# Patient Record
Sex: Female | Born: 1935 | Race: White | Hispanic: No | Marital: Married | Smoking: Never smoker
Health system: Southern US, Community
[De-identification: ages and names within clinical notes are randomized; demographics above are authoritative.]

## PROBLEM LIST (undated history)

## (undated) DIAGNOSIS — I10 Essential (primary) hypertension: Secondary | ICD-10-CM

## (undated) DIAGNOSIS — G3184 Mild cognitive impairment, so stated: Secondary | ICD-10-CM

## (undated) DIAGNOSIS — I671 Cerebral aneurysm, nonruptured: Secondary | ICD-10-CM

## (undated) DIAGNOSIS — M1990 Unspecified osteoarthritis, unspecified site: Secondary | ICD-10-CM

## (undated) DIAGNOSIS — N318 Other neuromuscular dysfunction of bladder: Secondary | ICD-10-CM

## (undated) HISTORY — PX: BREAST SURGERY: SHX581

## (undated) HISTORY — PX: TOTAL KNEE ARTHROPLASTY: SHX125

## (undated) HISTORY — DX: Cerebral aneurysm, nonruptured: I67.1

## (undated) HISTORY — PX: TONSILLECTOMY: SHX5217

## (undated) HISTORY — PX: CSF SHUNT: SHX92

## (undated) HISTORY — DX: Essential (primary) hypertension: I10

## (undated) HISTORY — DX: Mild cognitive impairment, so stated: G31.84

## (undated) HISTORY — DX: Other neuromuscular dysfunction of bladder: N31.8

## (undated) HISTORY — DX: Unspecified osteoarthritis, unspecified site: M19.90

---

## 1998-01-06 ENCOUNTER — Other Ambulatory Visit: Admission: RE | Admit: 1998-01-06 | Discharge: 1998-01-06 | Payer: Self-pay | Admitting: Internal Medicine

## 1998-03-14 ENCOUNTER — Inpatient Hospital Stay (HOSPITAL_COMMUNITY): Admission: RE | Admit: 1998-03-14 | Discharge: 1998-03-15 | Payer: Self-pay | Admitting: Neurosurgery

## 1998-03-16 ENCOUNTER — Other Ambulatory Visit: Admission: RE | Admit: 1998-03-16 | Discharge: 1998-03-16 | Payer: Self-pay | Admitting: Gynecology

## 1998-05-01 ENCOUNTER — Other Ambulatory Visit: Admission: RE | Admit: 1998-05-01 | Discharge: 1998-05-01 | Payer: Self-pay | Admitting: Gynecology

## 1998-06-19 ENCOUNTER — Ambulatory Visit (HOSPITAL_COMMUNITY): Admission: RE | Admit: 1998-06-19 | Discharge: 1998-06-19 | Payer: Self-pay | Admitting: Gynecology

## 1998-12-06 ENCOUNTER — Encounter: Payer: Self-pay | Admitting: Specialist

## 1998-12-06 ENCOUNTER — Ambulatory Visit (HOSPITAL_COMMUNITY): Admission: RE | Admit: 1998-12-06 | Discharge: 1998-12-06 | Payer: Self-pay | Admitting: Specialist

## 1999-07-19 ENCOUNTER — Other Ambulatory Visit: Admission: RE | Admit: 1999-07-19 | Discharge: 1999-07-19 | Payer: Self-pay | Admitting: Gynecology

## 1999-08-23 ENCOUNTER — Encounter: Payer: Self-pay | Admitting: Gynecology

## 1999-08-23 ENCOUNTER — Encounter: Admission: RE | Admit: 1999-08-23 | Discharge: 1999-08-23 | Payer: Self-pay | Admitting: Gynecology

## 2000-08-05 ENCOUNTER — Other Ambulatory Visit: Admission: RE | Admit: 2000-08-05 | Discharge: 2000-08-05 | Payer: Self-pay | Admitting: Internal Medicine

## 2000-08-25 ENCOUNTER — Encounter: Payer: Self-pay | Admitting: Gynecology

## 2000-08-25 ENCOUNTER — Encounter: Admission: RE | Admit: 2000-08-25 | Discharge: 2000-08-25 | Payer: Self-pay | Admitting: Gynecology

## 2001-03-13 ENCOUNTER — Other Ambulatory Visit: Admission: RE | Admit: 2001-03-13 | Discharge: 2001-03-13 | Payer: Self-pay | Admitting: Gynecology

## 2001-03-13 ENCOUNTER — Encounter (INDEPENDENT_AMBULATORY_CARE_PROVIDER_SITE_OTHER): Payer: Self-pay | Admitting: *Deleted

## 2001-09-07 ENCOUNTER — Encounter: Admission: RE | Admit: 2001-09-07 | Discharge: 2001-09-07 | Payer: Self-pay | Admitting: Internal Medicine

## 2001-09-07 ENCOUNTER — Other Ambulatory Visit: Admission: RE | Admit: 2001-09-07 | Discharge: 2001-09-07 | Payer: Self-pay | Admitting: Internal Medicine

## 2001-09-07 ENCOUNTER — Encounter: Payer: Self-pay | Admitting: Internal Medicine

## 2001-09-10 ENCOUNTER — Encounter: Payer: Self-pay | Admitting: Internal Medicine

## 2001-09-10 ENCOUNTER — Encounter: Admission: RE | Admit: 2001-09-10 | Discharge: 2001-09-10 | Payer: Self-pay | Admitting: Internal Medicine

## 2001-12-23 ENCOUNTER — Encounter (INDEPENDENT_AMBULATORY_CARE_PROVIDER_SITE_OTHER): Payer: Self-pay | Admitting: *Deleted

## 2001-12-23 ENCOUNTER — Ambulatory Visit (HOSPITAL_COMMUNITY): Admission: RE | Admit: 2001-12-23 | Discharge: 2001-12-23 | Payer: Self-pay | Admitting: Gastroenterology

## 2002-09-08 ENCOUNTER — Encounter: Payer: Self-pay | Admitting: Internal Medicine

## 2002-09-08 ENCOUNTER — Ambulatory Visit (HOSPITAL_COMMUNITY): Admission: RE | Admit: 2002-09-08 | Discharge: 2002-09-08 | Payer: Self-pay | Admitting: Internal Medicine

## 2002-09-30 ENCOUNTER — Encounter: Admission: RE | Admit: 2002-09-30 | Discharge: 2002-12-29 | Payer: Self-pay | Admitting: Internal Medicine

## 2003-01-03 ENCOUNTER — Encounter: Payer: Self-pay | Admitting: Orthopedic Surgery

## 2003-01-04 ENCOUNTER — Inpatient Hospital Stay (HOSPITAL_COMMUNITY): Admission: RE | Admit: 2003-01-04 | Discharge: 2003-01-07 | Payer: Self-pay | Admitting: Orthopedic Surgery

## 2003-01-17 ENCOUNTER — Ambulatory Visit (HOSPITAL_COMMUNITY): Admission: RE | Admit: 2003-01-17 | Discharge: 2003-01-17 | Payer: Self-pay | Admitting: *Deleted

## 2003-09-23 ENCOUNTER — Ambulatory Visit (HOSPITAL_COMMUNITY): Admission: RE | Admit: 2003-09-23 | Discharge: 2003-09-23 | Payer: Self-pay | Admitting: Internal Medicine

## 2004-12-18 ENCOUNTER — Encounter: Admission: RE | Admit: 2004-12-18 | Discharge: 2004-12-18 | Payer: Self-pay | Admitting: Internal Medicine

## 2004-12-25 ENCOUNTER — Encounter: Admission: RE | Admit: 2004-12-25 | Discharge: 2004-12-25 | Payer: Self-pay | Admitting: Internal Medicine

## 2005-09-09 LAB — HM COLONOSCOPY

## 2005-09-16 ENCOUNTER — Ambulatory Visit: Payer: Self-pay | Admitting: Internal Medicine

## 2006-01-01 ENCOUNTER — Encounter: Admission: RE | Admit: 2006-01-01 | Discharge: 2006-01-01 | Payer: Self-pay | Admitting: Internal Medicine

## 2006-03-25 ENCOUNTER — Ambulatory Visit: Payer: Self-pay | Admitting: Internal Medicine

## 2006-06-12 ENCOUNTER — Ambulatory Visit: Payer: Self-pay | Admitting: Internal Medicine

## 2006-07-01 ENCOUNTER — Ambulatory Visit: Payer: Self-pay | Admitting: Internal Medicine

## 2006-09-16 ENCOUNTER — Ambulatory Visit: Payer: Self-pay | Admitting: Internal Medicine

## 2007-01-13 ENCOUNTER — Ambulatory Visit: Payer: Self-pay | Admitting: Internal Medicine

## 2007-01-14 ENCOUNTER — Ambulatory Visit: Payer: Self-pay | Admitting: Internal Medicine

## 2007-01-14 LAB — CONVERTED CEMR LAB
Albumin: 3.9 g/dL (ref 3.5–5.2)
Alkaline Phosphatase: 81 units/L (ref 39–117)
Basophils Absolute: 0 10*3/uL (ref 0.0–0.1)
Cholesterol: 211 mg/dL (ref 0–200)
Eosinophils Relative: 4.2 % (ref 0.0–5.0)
GFR calc Af Amer: 104 mL/min
GFR calc non Af Amer: 86 mL/min
HCT: 45.4 % (ref 36.0–46.0)
Lymphocytes Relative: 27.5 % (ref 12.0–46.0)
Monocytes Absolute: 0.5 10*3/uL (ref 0.2–0.7)
Neutro Abs: 3.5 10*3/uL (ref 1.4–7.7)
Neutrophils Relative %: 59 % (ref 43.0–77.0)
Platelets: 235 10*3/uL (ref 150–400)
Potassium: 4.2 meq/L (ref 3.5–5.1)
Sodium: 142 meq/L (ref 135–145)
Total CHOL/HDL Ratio: 2.7
Triglycerides: 66 mg/dL (ref 0–149)

## 2007-02-18 ENCOUNTER — Ambulatory Visit (HOSPITAL_COMMUNITY): Admission: RE | Admit: 2007-02-18 | Discharge: 2007-02-18 | Payer: Self-pay | Admitting: Internal Medicine

## 2007-06-24 ENCOUNTER — Ambulatory Visit: Payer: Self-pay | Admitting: Internal Medicine

## 2007-07-13 ENCOUNTER — Encounter: Payer: Self-pay | Admitting: Internal Medicine

## 2007-07-14 ENCOUNTER — Ambulatory Visit: Payer: Self-pay | Admitting: Internal Medicine

## 2007-07-14 DIAGNOSIS — I10 Essential (primary) hypertension: Secondary | ICD-10-CM

## 2007-07-14 HISTORY — DX: Essential (primary) hypertension: I10

## 2007-10-20 ENCOUNTER — Emergency Department (HOSPITAL_COMMUNITY): Admission: EM | Admit: 2007-10-20 | Discharge: 2007-10-20 | Payer: Self-pay | Admitting: Emergency Medicine

## 2007-10-28 ENCOUNTER — Encounter: Payer: Self-pay | Admitting: Internal Medicine

## 2008-01-11 ENCOUNTER — Encounter: Payer: Self-pay | Admitting: Internal Medicine

## 2008-01-12 ENCOUNTER — Ambulatory Visit: Payer: Self-pay | Admitting: Internal Medicine

## 2008-01-12 DIAGNOSIS — M1990 Unspecified osteoarthritis, unspecified site: Secondary | ICD-10-CM | POA: Insufficient documentation

## 2008-01-12 HISTORY — DX: Unspecified osteoarthritis, unspecified site: M19.90

## 2008-01-12 LAB — CONVERTED CEMR LAB
ALT: 23 units/L (ref 0–35)
BUN: 17 mg/dL (ref 6–23)
CO2: 30 meq/L (ref 19–32)
Calcium: 9.2 mg/dL (ref 8.4–10.5)
Cholesterol: 211 mg/dL (ref 0–200)
Creatinine, Ser: 0.8 mg/dL (ref 0.4–1.2)
Direct LDL: 118.5 mg/dL
Eosinophils Absolute: 0.3 10*3/uL (ref 0.0–0.7)
HDL: 75.2 mg/dL (ref >39.0)
Hemoglobin: 15.2 g/dL — ABNORMAL HIGH (ref 12.0–15.0)
Lymphocytes Relative: 27 % (ref 12.0–46.0)
Monocytes Absolute: 0.5 10*3/uL (ref 0.1–1.0)
Monocytes Relative: 8.8 % (ref 3.0–12.0)
Neutrophils Relative %: 58.5 % (ref 43.0–77.0)
Potassium: 4.1 meq/L (ref 3.5–5.1)
RBC: 4.74 M/uL (ref 3.87–5.11)
TSH: 2.2 microintl units/mL (ref 0.35–5.50)
Total Bilirubin: 1 mg/dL (ref 0.3–1.2)
Total CHOL/HDL Ratio: 2.8
Triglycerides: 71 mg/dL (ref 0–149)
VLDL: 14 mg/dL (ref 0–40)

## 2008-03-01 ENCOUNTER — Encounter: Payer: Self-pay | Admitting: Internal Medicine

## 2008-03-08 ENCOUNTER — Ambulatory Visit (HOSPITAL_COMMUNITY): Admission: RE | Admit: 2008-03-08 | Discharge: 2008-03-08 | Payer: Self-pay | Admitting: Internal Medicine

## 2008-03-23 ENCOUNTER — Telehealth: Payer: Self-pay | Admitting: Internal Medicine

## 2008-06-29 ENCOUNTER — Ambulatory Visit: Payer: Self-pay | Admitting: Internal Medicine

## 2008-07-13 ENCOUNTER — Encounter: Payer: Self-pay | Admitting: Internal Medicine

## 2008-07-14 ENCOUNTER — Ambulatory Visit: Payer: Self-pay | Admitting: Internal Medicine

## 2008-07-14 DIAGNOSIS — N318 Other neuromuscular dysfunction of bladder: Secondary | ICD-10-CM

## 2008-07-14 HISTORY — DX: Other neuromuscular dysfunction of bladder: N31.8

## 2008-07-14 LAB — CONVERTED CEMR LAB
ALT: 27 units/L (ref 0–35)
AST: 20 units/L (ref 0–37)
Albumin: 4 g/dL (ref 3.5–5.2)
BUN: 21 mg/dL (ref 6–23)
Basophils Absolute: 0.1 10*3/uL (ref 0.0–0.1)
Calcium: 9.2 mg/dL (ref 8.4–10.5)
Chloride: 105 meq/L (ref 96–112)
Cholesterol: 196 mg/dL (ref 0–200)
Creatinine, Ser: 0.8 mg/dL (ref 0.4–1.2)
Eosinophils Absolute: 0.2 10*3/uL (ref 0.0–0.7)
Glucose, Bld: 97 mg/dL (ref 70–99)
HCT: 43.9 % (ref 36.0–46.0)
Hemoglobin: 15.1 g/dL — ABNORMAL HIGH (ref 12.0–15.0)
LDL Cholesterol: 103 mg/dL — ABNORMAL HIGH (ref 0–99)
MCHC: 34.4 g/dL (ref 30.0–36.0)
MCV: 95.8 fL (ref 78.0–100.0)
Monocytes Absolute: 0.4 10*3/uL (ref 0.1–1.0)
Monocytes Relative: 7 % (ref 3.0–12.0)
Neutro Abs: 3.7 10*3/uL (ref 1.4–7.7)
Platelets: 218 10*3/uL (ref 150–400)
Potassium: 4.4 meq/L (ref 3.5–5.1)
RBC: 4.58 M/uL (ref 3.87–5.11)
RDW: 13.1 % (ref 11.5–14.6)
Sodium: 143 meq/L (ref 135–145)
Total Protein: 6.8 g/dL (ref 6.0–8.3)

## 2008-08-23 ENCOUNTER — Telehealth: Payer: Self-pay | Admitting: Internal Medicine

## 2008-10-28 ENCOUNTER — Ambulatory Visit: Payer: Self-pay | Admitting: Internal Medicine

## 2009-01-12 ENCOUNTER — Ambulatory Visit: Payer: Self-pay | Admitting: Internal Medicine

## 2009-07-13 ENCOUNTER — Ambulatory Visit: Payer: Self-pay | Admitting: Internal Medicine

## 2009-07-13 DIAGNOSIS — G3184 Mild cognitive impairment, so stated: Secondary | ICD-10-CM

## 2009-07-13 HISTORY — DX: Mild cognitive impairment of uncertain or unknown etiology: G31.84

## 2009-08-30 ENCOUNTER — Ambulatory Visit (HOSPITAL_COMMUNITY): Admission: RE | Admit: 2009-08-30 | Discharge: 2009-08-30 | Payer: Self-pay | Admitting: Geriatric Medicine

## 2010-01-04 ENCOUNTER — Ambulatory Visit: Payer: Self-pay | Admitting: Internal Medicine

## 2010-01-04 LAB — CONVERTED CEMR LAB
Albumin: 4 g/dL (ref 3.5–5.2)
Basophils Relative: 0.9 % (ref 0.0–3.0)
Bilirubin Urine: NEGATIVE
Blood in Urine, dipstick: NEGATIVE
CO2: 28 meq/L (ref 19–32)
Calcium: 9.5 mg/dL (ref 8.4–10.5)
Chloride: 105 meq/L (ref 96–112)
Creatinine, Ser: 0.7 mg/dL (ref 0.4–1.2)
Direct LDL: 113.1 mg/dL
Eosinophils Absolute: 0.3 10*3/uL (ref 0.0–0.7)
Eosinophils Relative: 4.4 % (ref 0.0–5.0)
GFR calc non Af Amer: 85.57 mL/min (ref >60)
Glucose, Urine, Semiquant: NEGATIVE
HCT: 44.5 % (ref 36.0–46.0)
HDL: 79.7 mg/dL (ref >39.00)
Hemoglobin: 15 g/dL (ref 12.0–15.0)
Lymphs Abs: 1.8 10*3/uL (ref 0.7–4.0)
MCV: 94.9 fL (ref 78.0–100.0)
Monocytes Relative: 6.4 % (ref 3.0–12.0)
Neutro Abs: 3.6 10*3/uL (ref 1.4–7.7)
Neutrophils Relative %: 58.7 % (ref 43.0–77.0)
Platelets: 252 10*3/uL (ref 150.0–400.0)
RBC: 4.69 M/uL (ref 3.87–5.11)
Sodium: 140 meq/L (ref 135–145)
Specific Gravity, Urine: 1.015
Total Bilirubin: 0.8 mg/dL (ref 0.3–1.2)
Total Protein: 6.8 g/dL (ref 6.0–8.3)
VLDL: 20.8 mg/dL (ref 0.0–40.0)
pH: 7.5

## 2010-01-11 ENCOUNTER — Ambulatory Visit: Payer: Self-pay | Admitting: Internal Medicine

## 2010-03-23 ENCOUNTER — Encounter: Payer: Self-pay | Admitting: Internal Medicine

## 2010-03-23 ENCOUNTER — Telehealth: Payer: Self-pay | Admitting: Internal Medicine

## 2010-03-23 ENCOUNTER — Ambulatory Visit: Payer: Self-pay | Admitting: Internal Medicine

## 2010-03-23 DIAGNOSIS — L02419 Cutaneous abscess of limb, unspecified: Secondary | ICD-10-CM

## 2010-03-23 DIAGNOSIS — L03119 Cellulitis of unspecified part of limb: Secondary | ICD-10-CM

## 2010-03-26 ENCOUNTER — Encounter: Payer: Self-pay | Admitting: Internal Medicine

## 2010-03-26 ENCOUNTER — Telehealth: Payer: Self-pay | Admitting: Internal Medicine

## 2010-03-27 ENCOUNTER — Ambulatory Visit: Payer: Self-pay | Admitting: Internal Medicine

## 2010-03-28 ENCOUNTER — Encounter: Payer: Self-pay | Admitting: Internal Medicine

## 2010-03-28 ENCOUNTER — Ambulatory Visit: Payer: Self-pay

## 2010-04-05 ENCOUNTER — Encounter: Admission: RE | Admit: 2010-04-05 | Discharge: 2010-04-05 | Payer: Self-pay | Admitting: Neurosurgery

## 2010-04-12 ENCOUNTER — Ambulatory Visit (HOSPITAL_COMMUNITY): Admission: RE | Admit: 2010-04-12 | Discharge: 2010-04-12 | Payer: Self-pay | Admitting: Internal Medicine

## 2010-04-12 ENCOUNTER — Encounter: Payer: Self-pay | Admitting: Internal Medicine

## 2010-04-12 ENCOUNTER — Ambulatory Visit: Payer: Self-pay | Admitting: Internal Medicine

## 2010-04-12 ENCOUNTER — Ambulatory Visit: Payer: Self-pay

## 2010-04-16 ENCOUNTER — Telehealth: Payer: Self-pay | Admitting: Family Medicine

## 2010-05-08 ENCOUNTER — Other Ambulatory Visit: Admission: RE | Admit: 2010-05-08 | Discharge: 2010-05-08 | Payer: Self-pay | Admitting: Geriatric Medicine

## 2010-05-18 ENCOUNTER — Ambulatory Visit: Payer: Self-pay | Admitting: Internal Medicine

## 2010-07-12 ENCOUNTER — Ambulatory Visit: Payer: Self-pay | Admitting: Internal Medicine

## 2010-10-03 ENCOUNTER — Ambulatory Visit (HOSPITAL_COMMUNITY): Admission: RE | Admit: 2010-10-03 | Payer: Self-pay | Source: Home / Self Care | Admitting: Internal Medicine

## 2010-10-10 ENCOUNTER — Encounter: Payer: Self-pay | Admitting: Internal Medicine

## 2010-10-11 NOTE — Miscellaneous (Signed)
  Clinical Lists Changes  Allergies: Added new allergy or adverse reaction of AMOXICILLIN (AMOXICILLIN) Observations: Added new observation of NKA: F (03/26/2010 13:10)

## 2010-10-11 NOTE — Assessment & Plan Note (Signed)
Summary: cpx//ccm   Vital Signs:  Patient profile:   77 year old female Height:      60.75 inches Weight:      176 pounds BMI:     33.65 Temp:     98.1 degrees F oral BP sitting:   120 / 80  (right arm) Cuff size:   regular  Vitals Entered By: Duard Brady LPN (Jan 11, 4097 1:20 PM) CC: cpx -labs done , doing well Is Patient Diabetic? No   CC:  cpx -labs done  and doing well.  History of Present Illness: 77 -year-old history for a comprehensive evaluation.  problems include mild cognitive impairment.  She is on medication for an overactive bladder and has treated hypertension.  She has osteoarthritis and is status post right total knee replacement surgery.  She has done well recently and denies any cardiopulmonary complaints Here for Medicare AWV:  1.   Risk factors based on Past M, S, F history:  cardiovascular risk factors include hypertension.  She has no other cardiopulmonary risk factors.  She is a lifelong nonsmoker 2.   Physical Activities: participates with water aerobics 3 times weekly 3.   Depression/mood: no history of depression 4.   Hearing: no deficits 5.   ADL's: independent in all aspects of daily living 6.   Fall Risk: low fall risk 7.   Home Safety: no issues identified 8.   Height, weight, &visual acuity:  height and weight are stable.  Visual acuity is normal.  Has had a recent eye examination 9.   Counseling: more vigorous exercise and attempt at weight loss encouraged 10.   Labs ordered based on risk factors: complete laboratory profile, including lipid profile, and TSH were reviewed 11.           Referral Coordination- non-applicable 12.           Care Plan-  more rigorous exercise regimen attempt at weight loss encouraged 13.            Cognitive Assessment- patient has very mild cognitive impairment, which has been stable on Aricept   Preventive Screening-Counseling & Management  Alcohol-Tobacco     Smoking Status: never  Allergies  (verified): No Known Drug Allergies  Past History:  Past Medical History: Reviewed history from 01/12/2009 and no changes required. Hypertension  2003 Osteoarthritis history of cerebral aneurysm cognitive impairment overactive bladder  Past Surgical History: Reviewed history from 07/14/2008 and no changes required. remote breast abscess 1996, status post VP shunt for cerebral aneurysm status post right TKR Total knee replacement Tonsillectomy  colonoscopy 2007  Social History: Reviewed history from 07/14/2008 and no changes required. Married Never Smoked 4 children 3 daughters  Review of Systems  The patient denies anorexia, fever, weight loss, weight gain, vision loss, decreased hearing, hoarseness, chest pain, syncope, dyspnea on exertion, peripheral edema, prolonged cough, headaches, hemoptysis, abdominal pain, melena, hematochezia, severe indigestion/heartburn, hematuria, incontinence, genital sores, muscle weakness, suspicious skin lesions, transient blindness, difficulty walking, depression, unusual weight change, abnormal bleeding, enlarged lymph nodes, angioedema, and breast masses.    Physical Exam  General:  overweight-appearing.  120/64overweight-appearing.   Head:  Normocephalic and atraumatic without obvious abnormalities. No apparent alopecia or balding. Eyes:  No corneal or conjunctival inflammation noted. EOMI. Perrla. Funduscopic exam benign, without hemorrhages, exudates or papilledema. Vision grossly normal. Ears:  External ear exam shows no significant lesions or deformities.  Otoscopic examination reveals clear canals, tympanic membranes are intact bilaterally without bulging, retraction, inflammation or  discharge. Hearing is grossly normal bilaterally. Nose:  External nasal examination shows no deformity or inflammation. Nasal mucosa are pink and moist without lesions or exudates. Mouth:  Oral mucosa and oropharynx without lesions or exudates.   Neck:   No deformities, masses, or tenderness noted. Chest Wall:  No deformities, masses, or tenderness noted. Breasts:  No mass, nodules, thickening, tenderness, bulging, retraction, inflamation, nipple discharge or skin changes noted.   Lungs:  Normal respiratory effort, chest expands symmetrically. Lungs are clear to auscultation, no crackles or wheezes. Heart:  Normal rate and regular rhythm. S1 and S2 normal without gallop, murmur, click, rub or other extra sounds. Abdomen:  Bowel sounds positive,abdomen soft and non-tender without masses, organomegaly or hernias noted. Rectal:  No external abnormalities noted. Normal sphincter tone. No rectal masses or tenderness. Genitalia:  Normal introitus for age, no external lesions, no vaginal discharge, mucosa pink and moist, no vaginal or cervical lesions, no vaginal atrophy, no friaility or hemorrhage, normal uterus size and position, no adnexal masses or tenderness Msk:  No deformity or scoliosis noted of thoracic or lumbar spine.   Pulses:  R and L carotid,radial,femoral,dorsalis pedis and posterior tibial pulses are full and equal bilaterally Extremities:  No clubbing, cyanosis, edema, or deformity noted with normal full range of motion of all joints.   Neurologic:  alert & oriented X3, cranial nerves II-XII intact, strength normal in all extremities, sensation intact to pinprick, and heel-to-shin normal.  alert & oriented X3, cranial nerves II-XII intact, strength normal in all extremities, sensation intact to pinprick, and heel-to-shin normal.   Skin:  Intact without suspicious lesions or rashes Cervical Nodes:  No lymphadenopathy noted Axillary Nodes:  No palpable lymphadenopathy Inguinal Nodes:  No significant adenopathy Psych:  Cognition and judgment appear intact. Alert and cooperative with normal attention span and concentration. No apparent delusions, illusions, hallucinations   Impression & Recommendations:  Problem # 1:  Preventive Health  Care (ICD-V70.0)  Orders: First annual wellness visit with prevention plan  (G9562) Pelvic & Breast Exam ( Medicare)  (G0101)  Complete Medication List: 1)  Fosinopril Sodium 20 Mg Tabs (Fosinopril sodium) .Marland Kitchen.. 1 once daily 2)  Bayer Aspirin 325 Mg Tabs (Aspirin) .Marland Kitchen.. 1 once daily 3)  Meloxicam 15 Mg Tabs (Meloxicam) .Marland Kitchen.. 1 once daily 4)  Aricept 10 Mg Tabs (Donepezil hcl) .Marland Kitchen.. 1 once daily 5)  Oxybutynin Chloride 5 Mg Xr24h-tab (Oxybutynin chloride) .... One daily as needed  Other Orders: EKG w/ Interpretation (93000)  Patient Instructions: 1)  Limit your Sodium (Salt) to less than 2 grams a day(slightly less than 1/2 a teaspoon) to prevent fluid retention, swelling, or worsening of symptoms. 2)  It is important that you exercise regularly at least 20 minutes 5 times a week. If you develop chest pain, have severe difficulty breathing, or feel very tired , stop exercising immediately and seek medical attention. 3)  You need to lose weight. Consider a lower calorie diet and regular exercise.  4)  Take calcium +Vitamin D daily. 5)  Check your Blood Pressure regularly. If it is above: 150/90 you should make an appointment. Prescriptions: OXYBUTYNIN CHLORIDE 5 MG XR24H-TAB (OXYBUTYNIN CHLORIDE) one daily as needed  #90 x 6   Entered and Authorized by:   Gordy Savers  MD   Signed by:   Gordy Savers  MD on 01/11/2010   Method used:   Print then Give to Patient   RxID:   [id number] ARICEPT 10 MG TABS (DONEPEZIL HCL) 1  once daily  #90 x 6   Entered and Authorized by:   Gordy Savers  MD   Signed by:   Gordy Savers  MD on 01/11/2010   Method used:   Print then Give to Patient   RxID:   [id number] MELOXICAM 15 MG  TABS (MELOXICAM) 1 once daily  #90 x 6   Entered and Authorized by:   Gordy Savers  MD   Signed by:   Gordy Savers  MD on 01/11/2010   Method used:   Print then Give to Patient   RxID:   [id number] FOSINOPRIL SODIUM 20  MG  TABS (FOSINOPRIL SODIUM) 1 once daily  #90 x 6   Entered and Authorized by:   Gordy Savers  MD   Signed by:   Gordy Savers  MD on 01/11/2010   Method used:   Print then Give to Patient   RxID:   [id number]   Appended Document: cpx//ccm Impression:      Mild cognitive impairment-  will continue aricept-stable      Overactive bladder-  controlled- will continue oxybutynin      HTN- well controlled      DJD- stable-will continue meloxicam

## 2010-10-11 NOTE — Progress Notes (Signed)
Summary: cellulitis/ allergic reaction to lower leg./ACUTE  Phone Note Call from Patient   Caller: Patient  walks in Call For: Gordy Savers  MD Summary of Call: Pt walks in with diffuse swelling and redness of leg x several days.  ?Cellulitis????   Will  work with Dr. Kirtland Bouchard ASAP, please.  Pt is limping and in pain. Initial call taken by: Lynann Beaver CMA,  March 23, 2010 11:54 AM  Follow-up for Phone Call        ROV today Follow-up by: Gordy Savers  MD,  March 23, 2010 12:35 PM    and

## 2010-10-11 NOTE — Assessment & Plan Note (Signed)
Summary: Swollen Left leg/ok to put on sched. per Debbie/cb   Allergies: No Known Drug Allergies   Complete Medication List: 1)  Fosinopril Sodium 20 Mg Tabs (Fosinopril sodium) .Marland Kitchen.. 1 once daily 2)  Bayer Aspirin 325 Mg Tabs (Aspirin) .Marland Kitchen.. 1 once daily 3)  Meloxicam 15 Mg Tabs (Meloxicam) .Marland Kitchen.. 1 once daily 4)  Aricept 10 Mg Tabs (Donepezil hcl) .Marland Kitchen.. 1 once daily 5)  Oxybutynin Chloride 5 Mg Xr24h-tab (Oxybutynin chloride) .... One daily as needed

## 2010-10-11 NOTE — Assessment & Plan Note (Signed)
Summary: l?leg inf/ok deb/njr   Vital Signs:  Patient profile:   77 year old female Weight:      178 pounds Temp:     98.0 degrees F oral BP sitting:   120 / 80  (right arm) Cuff size:   regular  Vitals Entered By: Duard Brady LPN (March 23, 2010 12:11 PM) CC: c/o (L) leg swelling and redness x 3 days  Is Patient Diabetic? No   CC:  c/o (L) leg swelling and redness x 3 days .  History of Present Illness: 77 year old patient, who presents with a 4-day history of  increasing pain, redness and swelling of her distal left lower leg.  This occurred two days after spending time  outdoors but she is unaware of any insect bite or exposure to contact irritants.  Denies any fever, chills, or other systemic complaints.  He has a history of hypertension, which has been stable.  She also has osteoarthritis.  No drug allergies.  Denies any history of DVT or any pulmonary complaints  Allergies (verified): No Known Drug Allergies  Past History:  Past Medical History: Reviewed history from 01/12/2009 and no changes required. Hypertension  2003 Osteoarthritis history of cerebral aneurysm cognitive impairment overactive bladder  Past Surgical History: Reviewed history from 07/14/2008 and no changes required. remote breast abscess 1996, status post VP shunt for cerebral aneurysm status post right TKR Total knee replacement Tonsillectomy  colonoscopy 2007  Review of Systems       The patient complains of peripheral edema and suspicious skin lesions.    Physical Exam  General:  overweight-appearing.  no distress oroverweight-appearing.   Msk:  there is no popliteal tenderness on the left.  Check considerable erythema and soft tissue swelling circumferentially involving the left distal lower leg.  It  spared the ankle area, the region was warm, tender to touch Pulses:  R and L carotid,radial,femoral,dorsalis pedis and posterior tibial pulses are full and equal  bilaterally   Impression & Recommendations:  Problem # 1:  CELLULITIS, LEG, LEFT (ICD-682.6)  Her updated medication list for this problem includes:    Amoxicillin-pot Clavulanate 875-125 Mg Tabs (Amoxicillin-pot clavulanate) ..... One twice daily  Her updated medication list for this problem includes:    Amoxicillin-pot Clavulanate 875-125 Mg Tabs (Amoxicillin-pot clavulanate) ..... One twice daily  Problem # 2:  OSTEOARTHRITIS (ICD-715.90)  The following medications were removed from the medication list:    Meloxicam 15 Mg Tabs (Meloxicam) .Marland Kitchen... 1 once daily Her updated medication list for this problem includes:    Bayer Aspirin 325 Mg Tabs (Aspirin) .Marland Kitchen... 1 once daily  The following medications were removed from the medication list:    Meloxicam 15 Mg Tabs (Meloxicam) .Marland Kitchen... 1 once daily Her updated medication list for this problem includes:    Bayer Aspirin 325 Mg Tabs (Aspirin) .Marland Kitchen... 1 once daily  Problem # 3:  HYPERTENSION (ICD-401.9)  Her updated medication list for this problem includes:    Fosinopril Sodium 20 Mg Tabs (Fosinopril sodium) .Marland Kitchen... 1 once daily  Her updated medication list for this problem includes:    Fosinopril Sodium 20 Mg Tabs (Fosinopril sodium) .Marland Kitchen... 1 once daily  Complete Medication List: 1)  Fosinopril Sodium 20 Mg Tabs (Fosinopril sodium) .Marland Kitchen.. 1 once daily 2)  Bayer Aspirin 325 Mg Tabs (Aspirin) .Marland Kitchen.. 1 once daily 3)  Aricept 10 Mg Tabs (Donepezil hcl) .Marland Kitchen.. 1 once daily 4)  Oxybutynin Chloride 5 Mg Xr24h-tab (Oxybutynin chloride) .... One daily as needed 5)  Amoxicillin-pot Clavulanate 875-125 Mg Tabs (Amoxicillin-pot clavulanate) .... One twice daily  Patient Instructions: 1)  keep the left leg elevated as much as possible 2)  call if  fever or chills, worsening leg pain or swelling 3)  Please schedule a follow-up appointment in 2 weeks. 4)  Take your antibiotic as prescribed until ALL of it is gone, but stop if you develop a rash or swelling  and contact our office as soon as possible. Prescriptions: AMOXICILLIN-POT CLAVULANATE 875-125 MG TABS (AMOXICILLIN-POT CLAVULANATE) one twice daily  #20 x 0   Entered and Authorized by:   Gordy Savers  MD   Signed by:   Gordy Savers  MD on 03/23/2010   Method used:   Electronically to        [facility]* (retail)       [address]       [place], [place]  [id number]       Ph: [phone]       Fax: [phone]   RxID:   [id number]

## 2010-10-11 NOTE — Miscellaneous (Signed)
Summary: Orders Update  Clinical Lists Changes  Orders: Added new Test order of Venous Duplex Lower Extremity (Venous Duplex Lower) - Signed  Appended Document: Orders Update notify patient that no evidence of DVT  Appended Document: Orders Update attempt to call - ans amch - LMTCB - no DVT - xray normal. KIK

## 2010-10-11 NOTE — Assessment & Plan Note (Signed)
Summary: 6 month rov/njr   Vital Signs:  Patient profile:   77 year old female Weight:      172 pounds Temp:     97.8 degrees F oral BP sitting:   120 / 74  (right arm) Cuff size:   regular  Vitals Entered By: Duard Brady LPN (July 12, 2010 1:43 PM) CC: 6 mos rov - doing well Is Patient Diabetic? No Flu Vaccine Consent Questions     Do you have a history of severe allergic reactions to this vaccine? no    Any prior history of allergic reactions to egg and/or gelatin? no    Do you have a sensitivity to the preservative Thimersol? no    Do you have a past history of Guillan-Barre Syndrome? no    Do you currently have an acute febrile illness? no    Have you ever had a severe reaction to latex? no    Vaccine information given and explained to patient? yes    Are you currently pregnant? no    Lot Number:[id number]   Exp Date:03/09/2011   Site Given  Left Deltoid IM   CC:  6 mos rov - doing well.  History of Present Illness:  77 year old patient who is seen today for follow-up her in.  She has a history of hypertension, osteoarthritis, mild cognitive impairment.  She is doing quite well.  She is accompanied by two daughters.  She is on Aricept.  She is also on oxybutynin for overactive bladder.  She feels this offers very little benefit.  Her blood pressure has been well-controlled  Allergies: 1)  ! Amoxicillin (Amoxicillin)  Past History:  Past Medical History: Reviewed history from 01/12/2009 and no changes required. Hypertension  2003 Osteoarthritis history of cerebral aneurysm cognitive impairment overactive bladder  Family History: Reviewed history from 07/14/2007 and no changes required. father died at  65, lung, and laryngeal cancer mother died at 17 dementia  No siblings  Social History: Reviewed history from 01/11/2010 and no changes required. Married Never Smoked 4 children 3 daughters  Review of Systems  The patient denies anorexia, fever,  weight loss, weight gain, vision loss, decreased hearing, hoarseness, chest pain, syncope, dyspnea on exertion, peripheral edema, prolonged cough, headaches, hemoptysis, abdominal pain, melena, hematochezia, severe indigestion/heartburn, hematuria, incontinence, genital sores, muscle weakness, suspicious skin lesions, transient blindness, difficulty walking, depression, unusual weight change, abnormal bleeding, enlarged lymph nodes, angioedema, and breast masses.    Physical Exam  General:  overweight-appearing.  normal blood pressure Head:  Normocephalic and atraumatic without obvious abnormalities. No apparent alopecia or balding. Eyes:  No corneal or conjunctival inflammation noted. EOMI. Perrla. Funduscopic exam benign, without hemorrhages, exudates or papilledema. Vision grossly normal. Mouth:  Oral mucosa and oropharynx without lesions or exudates.  Teeth in good repair. Neck:  No deformities, masses, or tenderness noted. Lungs:  Normal respiratory effort, chest expands symmetrically. Lungs are clear to auscultation, no crackles or wheezes. Heart:  Normal rate and regular rhythm. S1 and S2 normal without gallop, murmur, click, rub or other extra sounds. Abdomen:  Bowel sounds positive,abdomen soft and non-tender without masses, organomegaly or hernias noted. Msk:  No deformity or scoliosis noted of thoracic or lumbar spine.   Extremities:  2+ left pedal edema and 1+ right pedal edema.   Cervical Nodes:  No lymphadenopathy noted   Impression & Recommendations:  Problem # 1:  MILD COGNITIVE IMPAIRMENT SO STATED (ICD-331.83)  Problem # 2:  OVERACTIVE BLADDER (ICD-596.51)  Problem # 3:  HYPERTENSION (ICD-401.9)  Her updated medication list for this problem includes:    Fosinopril Sodium 20 Mg Tabs (Fosinopril sodium) .Marland Kitchen... 1 once daily  Complete Medication List: 1)  Fosinopril Sodium 20 Mg Tabs (Fosinopril sodium) .Marland Kitchen.. 1 once daily 2)  Bayer Aspirin 325 Mg Tabs (Aspirin) .Marland Kitchen.. 1 once  daily 3)  Aricept 10 Mg Tabs (Donepezil hcl) .Marland Kitchen.. 1 once daily 4)  Oxybutynin Chloride 5 Mg Xr24h-tab (Oxybutynin chloride) .... One daily as needed 5)  Cipro 500 Mg Tabs (Ciprofloxacin hcl) .... One by mouth two times a day x 7 days  Other Orders: Flu Vaccine 75yrs + MEDICARE PATIENTS (Z6109) Administration Flu vaccine - MCR (U0454)  Patient Instructions: 1)  Please schedule a follow-up appointment in 6 months. 2)  Limit your Sodium (Salt). 3)  It is important that you exercise regularly at least 20 minutes 5 times a week. If you develop chest pain, have severe difficulty breathing, or feel very tired , stop exercising immediately and seek medical attention. 4)  You need to lose weight. Consider a lower calorie diet and regular exercise.  Prescriptions: OXYBUTYNIN CHLORIDE 5 MG XR24H-TAB (OXYBUTYNIN CHLORIDE) one daily as needed  #90 x 6   Entered and Authorized by:   Gordy Savers  MD   Signed by:   Gordy Savers  MD on 07/12/2010   Method used:   Electronically to        [facility]* (retail)       [address]       [place], [place]  [id number]       Ph: [phone]       Fax: [phone]   RxID:   [id number] ARICEPT 10 MG TABS (DONEPEZIL HCL) 1 once daily  #90 x 6   Entered and Authorized by:   Gordy Savers  MD   Signed by:   Gordy Savers  MD on 07/12/2010   Method used:   Electronically to        [facility]* (retail)       [address]       [place], [place]  [id number]       Ph: [phone]       Fax: [phone]   RxID:   [id number] FOSINOPRIL SODIUM 20 MG  TABS (FOSINOPRIL SODIUM) 1 once daily  #90 x 6   Entered and Authorized by:   Gordy Savers  MD   Signed by:   Gordy Savers  MD on 07/12/2010   Method used:   Electronically to        [facility]* (retail)       [address]       [place], [place]  [address]       Ph: [phone]       Fax: [phone]   RxID:    [id number]    Orders Added: 1)  Flu Vaccine 75yrs + MEDICARE PATIENTS [Q2039] 2)  Administration Flu vaccine - MCR [G0008] 3)  Est. Patient Level III [70350]   Immunization History:  Influenza Immunization History:    Influenza:  Fluvax 3+ (07/12/2010)  Pneumovax Immunization History:    Pneumovax:  Historical (06/23/2010)  H1N1 History:    H1N1 # 1:  H1N1 vaccine G code (10/28/2008)   Immunization History:  Pneumovax Immunization History:    Pneumovax:  Historical (06/23/2010) given at other medical office.

## 2010-10-11 NOTE — Assessment & Plan Note (Signed)
Summary: check leg//ccm   Vital Signs:  Patient profile:   77 year old female Weight:      172 pounds Temp:     98.1 degrees F oral BP sitting:   128 / 80  (left arm) Cuff size:   regular  Vitals Entered By: Duard Brady LPN (May 18, 2010 2:51 PM) CC: f/u on (L) lower leg - improving        declined flu vaccine - will be getting at other doctor's office next month Is Patient Diabetic? No   CC:  f/u on (L) lower leg - improving        declined flu vaccine - will be getting at other doctor's office next month.  History of Present Illness: 77 year old patient who is seen today for follow-up.  She was seen and treated earlier for left lower extremity cellulitis.  She has had some persistent edema and has improved.  She has a history of hypertension, osteoarthritis, and mild cognitive impairment.  She has been stable.  She will be seeing Dr. Pete Glatter next month and will obtain a flu vaccine at that time.  Her blood pressure has been stable.  Evaluation included lower extremity venous Doppler studies that were normal for DVT  Allergies: 1)  ! Amoxicillin (Amoxicillin)  Review of Systems       The patient complains of peripheral edema.  The patient denies anorexia, fever, weight loss, weight gain, vision loss, decreased hearing, hoarseness, chest pain, syncope, dyspnea on exertion, prolonged cough, headaches, hemoptysis, abdominal pain, melena, hematochezia, severe indigestion/heartburn, hematuria, incontinence, genital sores, muscle weakness, suspicious skin lesions, transient blindness, difficulty walking, depression, unusual weight change, abnormal bleeding, enlarged lymph nodes, angioedema, and breast masses.    Physical Exam  General:  overweight-appearing.  130/72overweight-appearing.   Head:  Normocephalic and atraumatic without obvious abnormalities. No apparent alopecia or balding. Mouth:  Oral mucosa and oropharynx without lesions or exudates.  Teeth in good  repair. Neck:  No deformities, masses, or tenderness noted. Lungs:  Normal respiratory effort, chest expands symmetrically. Lungs are clear to auscultation, no crackles or wheezes. Heart:  Normal rate and regular rhythm. S1 and S2 normal without gallop, murmur, click, rub or other extra sounds. Abdomen:  Bowel sounds positive,abdomen soft and non-tender without masses, organomegaly or hernias noted. Extremities:  llower leg distal to the knee to the ankle was edematous.  There is a mild postinflammatory dermal changes.  No excessive warmth or active infection   Impression & Recommendations:  Problem # 1:  OSTEOARTHRITIS (ICD-715.90)  Her updated medication list for this problem includes:    Bayer Aspirin 325 Mg Tabs (Aspirin) .Marland Kitchen... 1 once daily  Her updated medication list for this problem includes:    Bayer Aspirin 325 Mg Tabs (Aspirin) .Marland Kitchen... 1 once daily  Problem # 2:  HYPERTENSION (ICD-401.9)  Her updated medication list for this problem includes:    Fosinopril Sodium 20 Mg Tabs (Fosinopril sodium) .Marland Kitchen... 1 once daily  Her updated medication list for this problem includes:    Fosinopril Sodium 20 Mg Tabs (Fosinopril sodium) .Marland Kitchen... 1 once daily  Complete Medication List: 1)  Fosinopril Sodium 20 Mg Tabs (Fosinopril sodium) .Marland Kitchen.. 1 once daily 2)  Bayer Aspirin 325 Mg Tabs (Aspirin) .Marland Kitchen.. 1 once daily 3)  Aricept 10 Mg Tabs (Donepezil hcl) .Marland Kitchen.. 1 once daily 4)  Oxybutynin Chloride 5 Mg Xr24h-tab (Oxybutynin chloride) .... One daily as needed 5)  Cipro 500 Mg Tabs (Ciprofloxacin hcl) .... One by mouth two  times a day x 7 days  Patient Instructions: 1)  Please schedule a follow-up appointment in 6 months. 2)  Limit your Sodium (Salt) to less than 2 grams a day(slightly less than 1/2 a teaspoon) to prevent fluid retention, swelling, or worsening of symptoms. 3)  It is important that you exercise regularly at least 20 minutes 5 times a week. If you develop chest pain, have severe  difficulty breathing, or feel very tired , stop exercising immediately and seek medical attention.

## 2010-10-11 NOTE — Assessment & Plan Note (Signed)
Summary: discuss ordering more tests/dm   Vital Signs:  Patient profile:   77 year old female Weight:      178 pounds Temp:     98.3 degrees F oral BP sitting:   120 / 82  (right arm) Cuff size:   regular  Vitals Entered By: Duard Brady LPN (March 27, 2010 4:42 PM) CC: question about meds for (L) leg swelling  Is Patient Diabetic? No   CC:  question about meds for (L) leg swelling .  History of Present Illness: 77 year old patient who was seen 4 days ago with a suspected left lower extremity cellulitis.  She had a patchy area of the erythema.  Excess of warmth and tenderness just above the left ankle.  She was placed on Augmentin and developed lip and facial swelling.  This was discontinued and the patient is now on Cipro.  The area of distal erythema, excess of warmth  has improved but she now has more edema in distal to the left knee.  This also involves the left ankle and foot. She was seen in neurosurgery follow-up.  Recently, and a 2-D echocardiogram was suggested.  Allergies: 1)  ! Amoxicillin (Amoxicillin)  Past History:  Past Medical History: Reviewed history from 01/12/2009 and no changes required. Hypertension  2003 Osteoarthritis history of cerebral aneurysm cognitive impairment overactive bladder  Review of Systems       The patient complains of peripheral edema and suspicious skin lesions.  The patient denies anorexia, fever, weight loss, weight gain, vision loss, decreased hearing, hoarseness, chest pain, syncope, dyspnea on exertion, prolonged cough, headaches, hemoptysis, abdominal pain, melena, hematochezia, severe indigestion/heartburn, hematuria, incontinence, genital sores, muscle weakness, transient blindness, difficulty walking, depression, unusual weight change, abnormal bleeding, enlarged lymph nodes, angioedema, and breast masses.    Physical Exam  General:  Well-developed,well-nourished,in no acute distress; alert,appropriate and cooperative  throughout examination Neck:  No deformities, masses, or tenderness noted. Lungs:  Normal respiratory effort, chest expands symmetrically. Lungs are clear to auscultation, no crackles or wheezes. Heart:  Normal rate and regular rhythm. S1 and S2 normal without gallop, murmur, click, rub or other extra sounds. Extremities:  considerable edema distal to the left knee.  The left lower leg, above the ankle was still erythematous and slightly warm to touch, but improved.  There is mild generalized calf tenderness   Impression & Recommendations:  Problem # 1:  CELLULITIS, LEG, LEFT (ICD-682.6)  Her updated medication list for this problem includes:    Cipro 500 Mg Tabs (Ciprofloxacin hcl) ..... One by mouth two times a day x 7 days    rule-out left leg DVT  Her updated medication list for this problem includes:    Cipro 500 Mg Tabs (Ciprofloxacin hcl) ..... One by mouth two times a day x 7 days  Problem # 2:  HYPERTENSION (ICD-401.9)  Her updated medication list for this problem includes:    Fosinopril Sodium 20 Mg Tabs (Fosinopril sodium) .Marland Kitchen... 1 once daily   per neurosurgery recommendation assess LV function  Her updated medication list for this problem includes:    Fosinopril Sodium 20 Mg Tabs (Fosinopril sodium) .Marland Kitchen... 1 once daily  Complete Medication List: 1)  Fosinopril Sodium 20 Mg Tabs (Fosinopril sodium) .Marland Kitchen.. 1 once daily 2)  Bayer Aspirin 325 Mg Tabs (Aspirin) .Marland Kitchen.. 1 once daily 3)  Aricept 10 Mg Tabs (Donepezil hcl) .Marland Kitchen.. 1 once daily 4)  Oxybutynin Chloride 5 Mg Xr24h-tab (Oxybutynin chloride) .... One daily as needed 5)  Cipro  500 Mg Tabs (Ciprofloxacin hcl) .... One by mouth two times a day x 7 days  Other Orders: Echo Referral (Echo) Vascular Clinic (Vascular)  Patient Instructions: 1)  Limit your Sodium (Salt). 2)  Take your antibiotic as prescribed until ALL of it is gone, but stop if you develop a rash or swelling and contact our office as soon as possible. 3)   keep legs elevated as much as possible 4)  venous Doppler study scheduled

## 2010-10-11 NOTE — Progress Notes (Signed)
Summary: Echo results  Phone Note Call from Patient   Caller: Daughter Call For: Gordy Savers  MD Summary of Call: Daughter needs ECHO results ASAP.  Daughter is VERY upset.  Please call pt with these results as daughter does not have HIPPA rights.[phone]  Initial call taken by: Lynann Beaver CMA,  April 16, 2010 5:04 PM  Follow-up for Phone Call        notified pt of basically normal ECHO results. Follow-up by: Evelena Peat MD,  April 16, 2010 5:31 PM

## 2010-10-11 NOTE — Miscellaneous (Signed)
Summary: Appointment Canceled  Appointment status changed to canceled by [employer] on 03/30/2010 2:54 PM.  Cancellation Comments --------------------- appt @ 4:30/echo/htn/jml  Appointment Information ----------------------- Appt Type:  CARDIOLOGY ANCILLARY VISIT      Date:  Friday, April 06, 2010      Time:  4:00 PM for 60 min   Urgency:  Routine   Made By:  Pearson Grippe  To Visit:  [id number]    Reason:  appt @ 4:30/echo/htn/jml  Appt Comments ------------- -- 03/30/10 14:54: ([facility]) CANCELED -- appt @ 4:30/echo/htn/jml -- 03/28/10 9:05: ([facility]) BOOKED -- Routine CARDIOLOGY ANCILLARY VISIT at 04/06/2010 4:00 PM for 60 min appt @ 4:30/echo/htn/jml

## 2010-10-11 NOTE — Progress Notes (Signed)
Summary: lip swells  Phone Note Call from Patient   Caller: Patient Call For: Gordy Savers  MD Summary of Call: Pt is complaining of swelling of lip since starting Amoxicillin.  Is not uncomfortable or too noticeable.   [place]?  [phone]  Leg looks better.  Initial call taken by: Lynann Beaver CMA,  March 26, 2010 10:30 AM  Follow-up for Phone Call        stop the medication  and document in chart the allergy; start generic cipro 500  #14 one twice daily Follow-up by: Gordy Savers  MD,  March 26, 2010 12:29 PM    New/Updated Medications: CIPRO 500 MG TABS (CIPROFLOXACIN HCL) one by mouth two times a day x 7 days Prescriptions: CIPRO 500 MG TABS (CIPROFLOXACIN HCL) one by mouth two times a day x 7 days  #14 x 0   Entered by:   Lynann Beaver CMA   Authorized by:   Gordy Savers  MD   Signed by:   Lynann Beaver CMA on 03/26/2010   Method used:   Electronically to        [facility]* (retail)       [address]       [place], [place]  [id number]       Ph: [phone]       Fax: [phone]   RxID:   [id number]  Pt notified.

## 2011-01-16 ENCOUNTER — Encounter: Payer: Self-pay | Admitting: Internal Medicine

## 2011-01-22 ENCOUNTER — Telehealth: Payer: Self-pay

## 2011-01-22 ENCOUNTER — Ambulatory Visit (INDEPENDENT_AMBULATORY_CARE_PROVIDER_SITE_OTHER): Payer: Medicare Other | Admitting: Internal Medicine

## 2011-01-22 ENCOUNTER — Encounter: Payer: Self-pay | Admitting: Internal Medicine

## 2011-01-22 DIAGNOSIS — M1990 Unspecified osteoarthritis, unspecified site: Secondary | ICD-10-CM

## 2011-01-22 DIAGNOSIS — G3184 Mild cognitive impairment, so stated: Secondary | ICD-10-CM

## 2011-01-22 DIAGNOSIS — N318 Other neuromuscular dysfunction of bladder: Secondary | ICD-10-CM

## 2011-01-22 DIAGNOSIS — I10 Essential (primary) hypertension: Secondary | ICD-10-CM

## 2011-01-22 MED ORDER — FOSINOPRIL SODIUM 20 MG PO TABS: 20.0000 mg | ORAL_TABLET | Freq: Every day | ORAL | Status: DC

## 2011-01-22 MED ORDER — DONEPEZIL HCL 10 MG PO TABS: 10.0000 mg | ORAL_TABLET | Freq: Every day | ORAL | Status: DC

## 2011-01-22 NOTE — Progress Notes (Signed)
  Subjective:    Patient ID: Susan Landry, female    DOB: 11-28-1927, 77 y.o.   MRN: [id number]  HPI  an 77 year old patient who is seen today for followup. She has hypertension treated with Monopril 20 mg daily she has done quite well blood pressure has remained nicely controlled. She has a history of mild cognitive impairment and remains on Aricept 10 mg daily this has been stable. She has no cognitive concerns She has osteoarthritis. Takes no current medications for this her little the way of symptoms today She remains on oxybutynin for overactive bladder. No concerns or complaints medications refilled    Review of Systems  Constitutional: Negative.   HENT: Negative for hearing loss, congestion, sore throat, rhinorrhea, dental problem, sinus pressure and tinnitus.   Eyes: Negative for pain, discharge and visual disturbance.  Respiratory: Negative for cough and shortness of breath.   Cardiovascular: Negative for chest pain, palpitations and leg swelling.  Gastrointestinal: Negative for nausea, vomiting, abdominal pain, diarrhea, constipation, blood in stool and abdominal distention.  Genitourinary: Negative for dysuria, urgency, frequency, hematuria, flank pain, vaginal bleeding, vaginal discharge, difficulty urinating, vaginal pain and pelvic pain.  Musculoskeletal: Negative for joint swelling, arthralgias and gait problem.  Skin: Negative for rash.  Neurological: Negative for dizziness, syncope, speech difficulty, weakness, numbness and headaches.  Hematological: Negative for adenopathy.  Psychiatric/Behavioral: Negative for behavioral problems, dysphoric mood and agitation. The patient is not nervous/anxious.        Objective:   Physical Exam  Constitutional: She is oriented to person, place, and time. She appears well-developed and well-nourished.       Overweight. Blood pressure 130/80  HENT:  Head: Normocephalic.  Right Ear: External ear normal.  Left Ear: External ear  normal.  Mouth/Throat: Oropharynx is clear and moist.  Eyes: Conjunctivae and EOM are normal. Pupils are equal, round, and reactive to light.  Neck: Normal range of motion. Neck supple. No thyromegaly present.  Cardiovascular: Normal rate, regular rhythm, normal heart sounds and intact distal pulses.   Pulmonary/Chest: Effort normal and breath sounds normal.  Abdominal: Soft. Bowel sounds are normal. She exhibits no mass. There is no tenderness.  Musculoskeletal: Normal range of motion.  Lymphadenopathy:    She has no cervical adenopathy.  Neurological: She is alert and oriented to person, place, and time.  Skin: Skin is warm and dry. No rash noted.  Psychiatric: She has a normal mood and affect. Her behavior is normal.          Assessment & Plan:   Hypertension. Well controlled. We'll continue her present regimen stressed weight loss exercise and a low salt diet Overactive bladder stable on present regimen Osteoarthritis. Stable Mild cognitive impairment. Will refill Aricept.

## 2011-01-22 NOTE — Telephone Encounter (Signed)
Error - pt arrived late for appt - will work in

## 2011-01-22 NOTE — Patient Instructions (Signed)
Limit your sodium (Salt) intake  Please check your blood pressure on a regular basis.  If it is consistently greater than 150/90, please make an office appointment.  Return in 6 months for follow-up   

## 2011-01-25 NOTE — Op Note (Signed)
NAME:  Susan Landry, Susan Landry                         ACCOUNT NO.:  [id number]   MEDICAL RECORD NO.:  [id number]                   PATIENT TYPE:  INP   LOCATION:  NA                                   FACILITY:  [facility]   PHYSICIAN:  Robert A. Thurston Hole, M.D.              DATE OF BIRTH:  April 08, 1928   DATE OF PROCEDURE:  01/04/2003  DATE OF DISCHARGE:                                 OPERATIVE REPORT   PREOPERATIVE DIAGNOSIS:  Right knee degenerative joint disease.   POSTOPERATIVE DIAGNOSIS:  Right knee degenerative joint disease.   PROCEDURE:  Right total knee replacement using [employer]onics Scorpio total knee  system with a #7 cemented femoral component, a #7 cemented tibial component  with 15-mm polyethylene flex tibial spacer, and a 26-mm polyethylene  cemented patella component.   SURGEON:  Elana Alm. Thurston Hole, M.D.   ASSISTANT:  Julien Girt, P.A.   ANESTHESIA:  General.   OPERATIVE TIME:  One hour and 20 minutes.   COMPLICATIONS:  None.   DESCRIPTION OF PROCEDURE:  The patient was brought to the operating room on  January 04, 2003 and placed on the operative table in the supine position.  After an adequate level of general anesthesia was obtained, her right knee  was examined under anesthesia.  Range of motion from 0-125 degrees, moderate  varus deformity, knee stable ligamentous exam with normal patellar tracking.  She had a Foley catheter placed under sterile conditions and received Ancef  1 g IV preoperatively for prophylaxis.  Her right leg was prepped using  sterile DuraPrep and draped using sterile technique.  The leg was  exsanguinated and a thigh tourniquet elevated 375 mm.  Initially through a  20-cm longitudinal incision based over the patella, initial exposure was  made.  Underlying subcutaneous tissues were incised in line with the skin  incision.  A median arthrotomy was performed revealing an excessive amount  of normal-appearing joint fluid.  The articular surfaces  were inspected.  She had grade 4 changes medially, grade 3 changes laterally, and grade 3 and  4 changes in the patellofemoral joint.  Osteophytes were removed off the  femoral condyle and tibial plateau.  Medial and lateral meniscal remnants  were removed, as well as the anterior cruciate ligament.  An intramedullary  drill was then drilled up the femoral canal for placement of the distal  femoral cutting jig which was placed in the appropriate amount of rotation  and a distal 12-mm cut was made.  The distal femur was incised.  A #7 was  found to be the appropriate size and a #7 cutting jig was placed and then  these cuts were made.   After this was done, the proximal tibia was exposed.  Tibial spines were  removed with an oscillating saw.  Intramedullary drill drilled down the  tibial canal for placement of the proximal tibial cutting jig which was  placed in  the appropriate amount of rotation and a 4-mm proximal cut was  made.   After this was done, the Scorpio PCL cutter was placed back on the distal  femur and these cuts were made.  At this point, the #7 femoral trial was  placed.  The #7 tibial baseplate trial was placed and with a 15-mm  polyethylene spacer there was found to be excellent restoration of normal  alignment and excellent stability with range of motion from 0-125 degrees.  Tibial baseplate was then marked for rotation and the keel cut was made.   After this was done, the patella was sized and 26 mm was found to be the  appropriate size and a recessed 10 x 26 mm cut was made and three locking  holes were placed.   After this was done, it was felt that all the trial components were  excellent size, fit, and stability.  They were then removed.  The knee was  then gently lavage irrigated with 3 L of saline solution.  The proximal  tibia was then exposed.  The #7 tibial baseplate with cement backing was  then hammered into positioned with an excellent fit with excess  cement being  removed from around the edges.  The #7 femoral component with cement backing  was hammered in position also with an excellent fit with excess cement being  removed from around the edges.  The 15-mm polyethylene flex tibial spacer  was then locked on the tibial baseplate.  Knee taken through a range of  motion 0-120 degrees with excellent stability and correction of the varus  deformity.  The 26-mm polyethylene patella with cement neck was then locked  and this recessed and elevated with a clamp.  After the cement hardened,  patellofemoral tracking was evaluated and this was found to be normal.   At this point, it was felt that all the components were of excellent, size,  fit, and stability.  The knee was further irrigated with antibiotic  solution.  The median arthrotomy was then closed with #1 Ethibond suture  over two medium Hemovac drains.  Subcutaneous tissue was closed with 0 and 2-  0 Vicryl.  Skin closed with skin staples.  Sterile dressings were applied.  Hemovac injected with 0.25% Marcaine with epinephrine and clamped.  Tourniquet was released.  A long leg splint applied.  The patient then had a  femoral nerve block placed by anesthesia for postoperative pain control.  She was then awakened, extubated, and taken to the recovery room in stable  condition.  Needle and sponge counts correct x2 at the end of the case.                                               Robert A. Thurston Hole, M.D.    RAW/MEDQ  D:  01/04/2003  T:  01/04/2003  Job:  [id number]

## 2011-01-25 NOTE — Discharge Summary (Signed)
NAME:  Susan Landry, Susan Landry                         ACCOUNT NO.:  [id number]   MEDICAL RECORD NO.:  [id number]                   PATIENT TYPE:  INP   LOCATION:  5013                                 FACILITY:  [facility]   PHYSICIAN:  Robert A. Thurston Hole, M.D.              DATE OF BIRTH:   DATE OF ADMISSION:  01/04/2003  DATE OF DISCHARGE:  01/07/2003                                 DISCHARGE SUMMARY   ADMITTING DIAGNOSES:  1. End-stage degenerative joint disease right knee.  2. Hypertension.  3. History of seizure disorder.  4. History of brain aneurysm.   DISCHARGE DIAGNOSES:  1. End-stage degenerative joint disease right knee.  2. Hypertension.  3. History of seizure disorder.  4. History of brain aneurysm.  5. Urinary tract infection.   HISTORY OF PRESENT ILLNESS:  The patient is a 77 year old white female with  history of end-stage degenerative joint disease of her right knee for many  years.  She has tried antiinflammatories and cortisone injections, and rest  without success.  At this point in time, she has pain all the time at rest  or with activity unrelieved by anything.   She understands the risks, benefits and possible complications of a total  knee replacement, and is without question.   PROCEDURES IN HOUSE:  On January 04, 2003, the patient underwent a right total  knee replacement by Dr. Thurston Hole.  She was admitted postoperatively for pain  control, physical therapy and deep vein thrombosis prophylaxis.  On  postoperative day #1, her vital signs were stable.  She was afebrile.  Her  hemoglobin was 11.9.  Her glucose was high at 131.  Otherwise, she was  metabolically stable.  Surgical wound was well-approximated.  Her drain was  discontinued.  Her Foley was discontinued.  Her PCA was discontinued.  Mobilization was started.  On postoperative day #2, the patient progressed  well with physical therapy.  Her UA came back positive for bacteria.  Her  hemoglobin was 11.6.  She was  metabolically stable with a glucose of 126.  T-  max was 100.6.  She was started on Tequin 400 mg, one p.o. daily for seven  days for her UTI.  She continued to mobilize with physical therapy.  On  postoperative  day #3, the patient did very well.  Her hemoglobin was 10.6.  She is ambulating 120 feet with contact guard and minimal assist.  Her  hemoglobin was 10.6.  She was metabolically stable and glucose was 103.  Surgical wound was well approximated.  She had a moderate amount of  drainage.  She was discharged to home in stable condition after physical  therapy that day.  Dressing was changed.   DISCHARGE MEDICATIONS:  She was discharged to home on:  1. Percocet 1-2 q.4-6h. p.r.n. pain.  2. Coumadin 5 mg one p.o. every day  3. Monopril 20 mg one p.o. daily.  4.  Tequin 400 mg one p.o. daily.  5. We recommended she also buy Colace 100 mg, one tablet twice a day.  6. Senokot S, two tablets before dinner each night.   DISCHARGE INSTRUCTIONS:  1. She was instructed to keep her wound clean and dry.  2. Call with temperature greater than 101, increased redness, increased     swelling or increased drainage.  3. She will follow up with Dr. Thurston Hole in 10 days.   DISCHARGE CONDITION:  She was discharged to home, weightbearing as  tolerated, on a regular diet, in stable condition.     Kirstin Shepperson, P.A.                  Robert A. Thurston Hole, M.D.    KS/MEDQ  D:  02/02/2003  T:  02/03/2003  Job:  [id number]

## 2011-01-25 NOTE — Procedures (Signed)
[facility]  Patient:    Susan Landry, Susan Landry Visit Number: [id number] MRN: [id number]          Service Type: END Location: ENDO Attending Physician:  Dennison Bulla Ii Dictated by:   Verlin Grills, M.D. Proc. Date: 12/23/01 Admit Date:  12/23/2001   CC:         Julieanne Manson, M.D.  Timothy P. Fontaine, M.D.   Procedure Report  PROCEDURE:  Colonoscopy and polypectomy.  REFERRING PHYSICIAN:  Julieanne Manson, M.D.  PROCEDURE INDICATION:  Susan Landry is a 77 year old female, born [birth year].  [name] is referred for diagnostic colonoscopy to evaluate guaiac-positive stool.  She denies a personal or family history of colon cancer.  I discussed with Susan Landry the complications associated with colonoscopy and polypectomy, including a 15 per 1000 risk of bleeding and 4 per 1000 risk of colon perforation requiring surgical repair.  Susan Landry has signed the operative permit.  ENDOSCOPIST:  Verlin Grills, M.D.  PREMEDICATION:  Demerol 50 mg, Versed 5 mg.  ENDOSCOPE:  Olympus pediatric colonoscope.  DESCRIPTION OF PROCEDURE:  After obtaining informed consent, [name] was placed in the left lateral decubitus position.  I administered intravenous Demerol and intravenous Versed to achieve conscious sedation for the procedure.  The patients blood pressure, oxygen saturation, and cardiac rhythm were monitored throughout the procedure and documented in the medical record.  Anal inspection was normal.  Digital rectal exam was normal.  The Olympus pediatric video colonoscope was introduced into the rectum and easily advanced to the cecum.  Colonic preparation for the exam today was excellent.  RECTUM AND SIGMOID COLON:  From the distal sigmoid colon at 25 cm from the anal verge, a 0.5 mm sessile polyp was removed with the cold biopsy forceps; from the proximal rectum, a 0.5 mm sessile was removed with the cold  biopsy forceps.  Both polyps were submitted in one bottle for pathological evaluation.  DESCENDING COLON:  Normal.  SPLENIC FLEXURE:  Normal.  TRANSVERSE COLON:  Normal.  HEPATIC FLEXURE:  Normal.  ASCENDING COLON:  Normal.  CECUM AND ILEOCECAL VALVE:  Normal.  ASSESSMENT: 1. A 0.5 mm sessile polyp was removed from the proximal rectum, and a 0.5 mm    sessile polyp was removed from the distal sigmoid colon.  Both polyps were    submitted in one bottle for pathological evaluation. 2. Otherwise normal proctocolonoscopy to the cecum.  RECOMMENDATIONS:  If colonic polyps return neoplastic pathologically, Susan Landry should undergo a repeat colonoscopy in five years.  If the polyps are nonneoplastic pathologically, Susan Landry should undergo a repeat colonoscopy in 10 years. Dictated by:   Verlin Grills, M.D. Attending Physician:  Dennison Bulla Ii DD:  12/23/01 TD:  12/23/01 Job: [id number]

## 2011-01-25 NOTE — Assessment & Plan Note (Signed)
[facility]                            [facility] OFFICE NOTE   NAME:MIDGETTDenya, Susan Landry                      MRN:          [id number]  DATE:09/16/2006                            DOB:          1928/07/21    A 77 year old female seen today for an annual exam.  She has a long  history of hypertension, now treated for about 4 years.  She has a  history of cerebral aneurysm status post clipping by Dr. Channing Landry in 1996.  She required a VP shunt at that time.  She has DJD, status post right  knee replacement surgery.   REVIEW OF SYSTEMS:  Exam is negative.  Did have a followup colonoscopy  earlier this year.   FAMILY HISTORY:  Unchanged.  Please see prior note from 1 year ago.   EXAM:  An overweight, white female in no acute distress.  Blood pressure was 110/72.  SKIN:  Negative except for senile changes.  FUNDI, EARS, NOSE, AND THROAT:  Clear.  No bruits.  CHEST:  Clear.  BREASTS:  Negative.  CARDIOVASCULAR:  Normal heart sounds, no murmurs.  ABDOMEN:  Obese, soft, and nontender.  Surgical scar is noted.  EXTREMITIES:  Negative.  She has some mild venostasis changes and some edema involving the left  foot and ankle.  Peripheral pulses were intact.  NEURO:  Negative.   IMPRESSION:  1. Hypertension.  2. Degenerative joint disease.  3. Obesity.  4. History of cerebral aneurysm.   DISPOSITION:  Medical regimen unchanged.  We will reassess in 4 months.  Fasting panel checked at that time.     Susan Savers, MD  Electronically Signed    PFK/MedQ  DD: 09/16/2006  DT: 09/16/2006  Job #: [id number]

## 2011-04-11 ENCOUNTER — Emergency Department (HOSPITAL_COMMUNITY): Payer: Medicare Other

## 2011-04-11 ENCOUNTER — Encounter: Payer: Self-pay | Admitting: Internal Medicine

## 2011-04-11 ENCOUNTER — Inpatient Hospital Stay (HOSPITAL_COMMUNITY)
Admission: EM | Admit: 2011-04-11 | Discharge: 2011-04-15 | DRG: 195 | Disposition: A | Discharge status: Home / Self Care | Payer: Medicare Other | Attending: Internal Medicine | Admitting: Internal Medicine

## 2011-04-11 ENCOUNTER — Ambulatory Visit (INDEPENDENT_AMBULATORY_CARE_PROVIDER_SITE_OTHER): Payer: Medicare Other | Admitting: Internal Medicine

## 2011-04-11 DIAGNOSIS — I10 Essential (primary) hypertension: Secondary | ICD-10-CM | POA: Diagnosis present

## 2011-04-11 DIAGNOSIS — G3184 Mild cognitive impairment, so stated: Secondary | ICD-10-CM

## 2011-04-11 DIAGNOSIS — N318 Other neuromuscular dysfunction of bladder: Secondary | ICD-10-CM

## 2011-04-11 DIAGNOSIS — Z7982 Long term (current) use of aspirin: Secondary | ICD-10-CM

## 2011-04-11 DIAGNOSIS — Z96659 Presence of unspecified artificial knee joint: Secondary | ICD-10-CM

## 2011-04-11 DIAGNOSIS — R0902 Hypoxemia: Secondary | ICD-10-CM | POA: Diagnosis present

## 2011-04-11 DIAGNOSIS — D72829 Elevated white blood cell count, unspecified: Secondary | ICD-10-CM | POA: Diagnosis present

## 2011-04-11 DIAGNOSIS — M1990 Unspecified osteoarthritis, unspecified site: Secondary | ICD-10-CM

## 2011-04-11 DIAGNOSIS — J449 Chronic obstructive pulmonary disease, unspecified: Secondary | ICD-10-CM | POA: Diagnosis present

## 2011-04-11 DIAGNOSIS — Z8673 Personal history of transient ischemic attack (TIA), and cerebral infarction without residual deficits: Secondary | ICD-10-CM

## 2011-04-11 DIAGNOSIS — J189 Pneumonia, unspecified organism: Principal | ICD-10-CM | POA: Diagnosis present

## 2011-04-11 DIAGNOSIS — J4489 Other specified chronic obstructive pulmonary disease: Secondary | ICD-10-CM | POA: Diagnosis present

## 2011-04-11 DIAGNOSIS — E860 Dehydration: Secondary | ICD-10-CM | POA: Diagnosis present

## 2011-04-11 LAB — BASIC METABOLIC PANEL
CO2: 29 mEq/L (ref 19–32)
Chloride: 101 mEq/L (ref 96–112)
Potassium: 3.9 mEq/L (ref 3.5–5.1)
Sodium: 140 mEq/L (ref 135–145)

## 2011-04-11 LAB — POCT I-STAT 3, ART BLOOD GAS (G3+)
Acid-Base Excess: 1 mmol/L (ref 0.0–2.0)
pH, Arterial: 7.442 — ABNORMAL HIGH (ref 7.350–7.400)

## 2011-04-11 LAB — CK TOTAL AND CKMB (NOT AT ARMC)
Relative Index: INVALID (ref 0.0–2.5)
Total CK: 62 U/L (ref 7–177)

## 2011-04-11 LAB — DIFFERENTIAL
Basophils Relative: 0 % (ref 0–1)
Lymphocytes Relative: 10 % — ABNORMAL LOW (ref 12–46)
Lymphs Abs: 1.2 10*3/uL (ref 0.7–4.0)
Monocytes Relative: 9 % (ref 3–12)
Neutro Abs: 9.7 10*3/uL — ABNORMAL HIGH (ref 1.7–7.7)
Neutrophils Relative %: 80 % — ABNORMAL HIGH (ref 43–77)

## 2011-04-11 LAB — CBC
Platelets: 263 10*3/uL (ref 150–400)
RBC: 4.26 MIL/uL (ref 3.87–5.11)
WBC: 12.1 10*3/uL — ABNORMAL HIGH (ref 4.0–10.5)

## 2011-04-11 LAB — TROPONIN I: Troponin I: <0.3 ng/mL (ref <0.30)

## 2011-04-11 LAB — PRO B NATRIURETIC PEPTIDE: Pro B Natriuretic peptide (BNP): 76.1 pg/mL (ref 0–450)

## 2011-04-11 NOTE — H&P (Signed)
Susan Landry, Susan Landry NO.:  [id number]  MEDICAL RECORD NO.:  [id number]  LOCATION:  [facility]                         FACILITY:  [facility]  PHYSICIAN:  Talmage Nap, MD  DATE OF BIRTH:  [birth year]  DATE OF ADMISSION:  04/11/2011 DATE OF DISCHARGE:                             HISTORY & PHYSICAL   PRIMARY CARE PHYSICIAN:  Gordy Savers, MD  History obtainable from the patient and the patient's daughter.  CHIEF COMPLAINT:  Cough on and off about for 7-10 days duration.  HISTORY OF PRESENT ILLNESS:  The patient is an 77 year old very pleasant Caucasian female with history of hypertension, cerebral aneurysm status post clipping, presented to the emergency room with cough which has been on and off for about 7-10 days and got progressively worse 24-48 hours prior to presenting to the emergency room.  Cough is said to be nonproductive of sputum.  The patient denied any associated pleuritic chest pain.  She denied any fever.  She denied any chills.  She deniedany rigor.She also denied vomiting.Cough was said have persisted hence she was brought to the emergency room to be evaluated.  PAST MEDICAL HISTORY: 1. Hypertension. 2. Cerebral aneurysm. 3. Osteoarthritis.  PAST SURGICAL HISTORY: 1. Cerebral aneurysm status post clipping. 2. Colonoscopy. 3. Right knee replacement. 4. Tonsillectomy.  PREADMISSION MEDICATIONS WITHOUT DOSAGES: 1. Aricept. 2. Aspirin. 3. Lisinopril.  ALLERGIES:  She has no known allergies.  SOCIAL HISTORY:  Negative for tobacco use and occasionally takes wine.  The patient lives at home with her spouse.  FAMILY HISTORY:  Positive for hypertension.  REVIEW OF SYSTEMS:  The patient denies any history of headaches, no blurred vision, no nausea or vomiting, no fever, no chills, and no rigor.  Complained of cough that is nonproductive of sputum with no associated pleuritic chest pain.  She denies any diaphoresis.  She denied any PND,  orthopnea.  Complained of dryness of the mouth.  No abdominal discomfort.  No diarrhea or hematochezia.  No dysuria or hematuria.  No swelling of the lower extremities.  No intolerance to heat or cold.  No neuropsychiatric disorder.  PHYSICAL EXAMINATION:  GENERAL:  Very pleasant lady, dehydrated, but not in any  respiratory distress. VITAL SIGNS:  Blood pressure is 131/78, pulse is 94, respiratory rate 23, and temperature is 99.8. HEENT:  Mild pallor, but pupils are reactive to light and extraocular muscles are intact. NECK:  No jugular venous distention.  No carotid bruit.  No lymphadenopathy. CHEST:  Crackles in the left mid and lower zones of the lungs with minimal scattered rhonchi. HEART:  Heart sounds are 1 and 2. ABDOMEN:  Soft and nontender.  Liver, spleen, and kidney are not palpable.  Bowel sounds are positive. EXTREMITIES:  No pedal edema. NEUROLOGIC:  Nonfocal. MUSCULOSKELETAL:  Arthritic change in the knees and in the feet. NEUROPSYCHIATRIC:  Unremarkable. SKIN:  Decreased turgor.  LABORATORY DATA:  BNP 76.1.  Chemistry showed sodium of 140, potassium of 3.9, chloride of 102, bicarb of 29, glucose is 106, BUN is 16, and creatinine 0.58.  Hematological indices with differential showed WBC of 12.1, hemoglobin of 13.6, hematocrit of 39.2, MCV of 92.0 with a platelet count of  263, neutrophils 80%, and absolute neutrophil count is 9.7.  Arterial blood gas showed pH of 7.44, pCO2 of 36.2, pO2 of 58 low with a bicarb of 24.8.  Chest x-ray showed patchy infiltrate in left mid and lower zones of the lungs.  EKG shows sinus rhythm with a rate of 68 beats per minute.  No acute ST elevation or depression seen.  IMPRESSION: 1. Left lower lobe pneumonia. 2. Hypoxia. 3. Dehydration. 4. Hypertension. 5. Osteoarthritis. 6. History of cerebral aneurysm status post clipping.  PLAN:  Admit the patient to general medical floor.  The patient will be slowly rehydrated with  half-normal saline IV to go at rate of 75 mL an hour.O2 via nasal canulla 3liters/min.She will be on Rocephin 1 gram IV q.24 and Zithromax 500 mg IV q.24.She also will be on albuterol and Atrovent nebs q.6 hourly. Other medication to be given to the patient will include Mucinex 100 mg p.o. b.i.d.  Because of the persistent on and off cough, lisinopril will be discontinued and the patient will be on Diovan 80 mg p.o. daily.  She also will be on Aricept 5 mg p.o. daily.  GI prophylaxis will be done with Protonix 40 mg p.o. daily and TED stockings for DVT prophylaxis. Further workup to be done on this patient will include cardiac enzymes q.6 x3, blood culture x2 before starting IV antibiotics, CBC, CMP, and magnesium will be repeated in a.m.  The patient will be followed and evaluated on day-to-day basis.     Talmage Nap, MD     CN/MEDQ  D:  04/11/2011  T:  04/11/2011  Job:  [id number]  Electronically Signed by Talmage Nap  on 04/11/2011 09:22:54 PM

## 2011-04-11 NOTE — Progress Notes (Signed)
Subjective:    Patient ID: Susan Landry, female    DOB: [birth year], 77 y.o.   MRN: [id number]  HPI   77 year old patient who is seen today for followup. She presents with a one-week history of chest congestion. She has a nonproductive cough and has noted occasional wheezing she has a long history of dyspnea on exertion which has not changed. She is a lifelong nonsmoker but has a long history of significant exposure to passive tobacco. Her father died of lung cancer. She presents today with a chief complaint of nonproductive cough and noted to have hypoxemia with a O2 saturation of 88% and a resting pulse rate of 112. Clinical examination revealed diminished aeration and dullness to percussion involving the right lower lung. She is now admitted for further evaluation and treatment of her hypoxemia and to rule out pneumonia effusion atelectasis etc.   Alcohol-Tobacco  Smoking Status: never  Allergies (verified):  No Known Drug Allergies   Past History:  Past Medical History:  Reviewed history from 01/12/2009 and no changes required.  Hypertension 2003  Osteoarthritis  history of cerebral aneurysm  cognitive impairment  overactive bladder   Past Surgical History:  Reviewed history from 07/14/2008 and no changes required.  remote breast abscess  1996, status post VP shunt for cerebral aneurysm  status post right TKR  Total knee replacement  Tonsillectomy  colonoscopy 2007   Social History:  Reviewed history from 07/14/2008 and no changes required.  Married  Never Smoked  4 children 3 daughters    Review of Systems  Constitutional: Negative.   HENT: Negative for hearing loss, congestion, sore throat, rhinorrhea, dental problem, sinus pressure and tinnitus.   Eyes: Negative for pain, discharge and visual disturbance.  Respiratory: Positive for cough and wheezing. Negative for shortness of breath.   Cardiovascular: Negative for chest pain, palpitations and leg swelling.    Gastrointestinal: Negative for nausea, vomiting, abdominal pain, diarrhea, constipation, blood in stool and abdominal distention.  Genitourinary: Negative for dysuria, urgency, frequency, hematuria, flank pain, vaginal bleeding, vaginal discharge, difficulty urinating, vaginal pain and pelvic pain.  Musculoskeletal: Negative for joint swelling, arthralgias and gait problem.  Skin: Negative for rash.  Neurological: Negative for dizziness, syncope, speech difficulty, weakness, numbness and headaches.  Hematological: Negative for adenopathy.  Psychiatric/Behavioral: Negative for behavioral problems, dysphoric mood and agitation. The patient is not nervous/anxious.        Objective:   Physical Exam  Constitutional: She is oriented to person, place, and time. She appears well-developed and well-nourished.       Mild resting tachypnea; blood pressure 120/78 O2 saturation 88% Pulse rate 112  HENT:  Head: Normocephalic.  Right Ear: External ear normal.  Left Ear: External ear normal.  Mouth/Throat: Oropharynx is clear and moist.  Eyes: Conjunctivae and EOM are normal. Pupils are equal, round, and reactive to light.  Neck: Normal range of motion. Neck supple. No thyromegaly present.       Prominent jugular venous pressure  Cardiovascular: Normal rate, regular rhythm, normal heart sounds and intact distal pulses.        Resting tachycardia  Pulmonary/Chest: Effort normal and breath sounds normal.       Mild resting tachypnea Diminished breath sounds and dullness to percussion at the right lower lung  Abdominal: Soft. Bowel sounds are normal. She exhibits no mass. There is no tenderness.  Musculoskeletal: Normal range of motion. She exhibits no edema.  Lymphadenopathy:    She has no cervical adenopathy.  Neurological: She  is alert and oriented to person, place, and time.  Skin: Skin is warm and dry. No rash noted.  Psychiatric: She has a normal mood and affect. Her behavior is normal.           Assessment & Plan:   Hypoxemia.  Rule out right lower lobe pneumonia pleural effusion etc. We'll refer her to the emergency department for chest x-ray and labs. Will likely need hospital admission

## 2011-04-11 NOTE — Patient Instructions (Signed)
Report to [facility] emergency department for chest x-ray and further evaluation

## 2011-04-12 LAB — COMPREHENSIVE METABOLIC PANEL
Albumin: 3 g/dL — ABNORMAL LOW (ref 3.5–5.2)
BUN: 11 mg/dL (ref 6–23)
CO2: 28 mEq/L (ref 19–32)
Chloride: 102 mEq/L (ref 96–112)
Creatinine, Ser: 0.57 mg/dL (ref 0.50–1.10)
GFR calc Af Amer: >60 mL/min (ref >60)
GFR calc non Af Amer: >60 mL/min (ref >60)
Glucose, Bld: 121 mg/dL — ABNORMAL HIGH (ref 70–99)
Total Bilirubin: 0.5 mg/dL (ref 0.3–1.2)

## 2011-04-12 LAB — CARDIAC PANEL(CRET KIN+CKTOT+MB+TROPI)
Relative Index: INVALID (ref 0.0–2.5)
Total CK: 84 U/L (ref 7–177)
Troponin I: <0.3 ng/mL (ref <0.30)

## 2011-04-12 LAB — CBC
MCV: 92 fL (ref 78.0–100.0)
Platelets: 263 10*3/uL (ref 150–400)
RDW: 13.6 % (ref 11.5–15.5)
WBC: 14.6 10*3/uL — ABNORMAL HIGH (ref 4.0–10.5)

## 2011-04-12 LAB — DIFFERENTIAL
Basophils Absolute: 0 10*3/uL (ref 0.0–0.1)
Eosinophils Absolute: 0.1 10*3/uL (ref 0.0–0.7)
Eosinophils Relative: 0 % (ref 0–5)
Lymphs Abs: 1.4 10*3/uL (ref 0.7–4.0)

## 2011-04-12 LAB — MAGNESIUM: Magnesium: 2.2 mg/dL (ref 1.5–2.5)

## 2011-04-13 LAB — CBC
HCT: 37.9 % (ref 36.0–46.0)
Hemoglobin: 12.7 g/dL (ref 12.0–15.0)
RBC: 4.12 MIL/uL (ref 3.87–5.11)
WBC: 15.3 10*3/uL — ABNORMAL HIGH (ref 4.0–10.5)

## 2011-04-14 ENCOUNTER — Inpatient Hospital Stay (HOSPITAL_COMMUNITY): Payer: Medicare Other

## 2011-04-14 LAB — CBC
HCT: 44.7 % (ref 36.0–46.0)
Hemoglobin: 15.3 g/dL — ABNORMAL HIGH (ref 12.0–15.0)
MCH: 31.3 pg (ref 26.0–34.0)
MCHC: 34.2 g/dL (ref 30.0–36.0)
RBC: 4.89 MIL/uL (ref 3.87–5.11)

## 2011-04-15 LAB — CBC
HCT: 38.7 % (ref 36.0–46.0)
MCH: 30.7 pg (ref 26.0–34.0)
MCV: 91.3 fL (ref 78.0–100.0)
Platelets: 346 10*3/uL (ref 150–400)
RBC: 4.24 MIL/uL (ref 3.87–5.11)
RDW: 13.6 % (ref 11.5–15.5)

## 2011-04-16 ENCOUNTER — Telehealth: Payer: Self-pay | Admitting: Internal Medicine

## 2011-04-16 NOTE — Discharge Summary (Signed)
Susan Landry, [place]BON[place] NO.:  [id number]  MEDICAL RECORD NO.:  [id number]  LOCATION:  5507                         FACILITY:  [facility]  PHYSICIAN:  Zannie Cove, MD     DATE OF BIRTH:  19-Oct-1927  DATE OF ADMISSION:  04/11/2011 DATE OF DISCHARGE:  04/15/2011                              DISCHARGE SUMMARY   PRIMARY CARE PHYSICIAN:  Gordy Savers, MD  DISCHARGE DIAGNOSES: 1. Community-acquired pneumonia. 2. Suspected underlying chronic obstructive pulmonary disease due to     chronic passive tobacco exposure. 3. History of hypertension. 4. History of cerebral aneurysm, status post clipping back in 1996. 5. History of degenerative joint disease. 6. History of right total knee arthroplasty.  DISCHARGE MEDICATIONS: 1. Albuterol MDI 1-2 puffs q.4 h. p.r.n. 2. Mucinex 600 mg p.o. b.i.d. p.r.n. 3. Moxifloxacin 400 mg p.o. daily for 6 days. 4. Aspirin 325 mg daily. 5. Calcium carbonate with vitamin D 1 tablet q.p.m. 6. Donepezil 10 mg daily. 7. Lisinopril 20 mg daily. 8. Multivitamin 1 tablet daily.  DIAGNOSTICS AND INVESTIGATIONS:  Chest x-ray on August 2 showed central bronchial wall thickening, increased radicular markings suggest reactive airway disease or atypical infectious in etiology, patchy left mid and left lower lobe airspace opacity which could represent a developing pneumonia.  Repeat chest x-ray shows worsening bibasilar atelectasis.  HOSPITAL COURSE:  Susan Landry is a very pleasant 77 year old white female with remote history of cerebral aneurysm status post clipping and hypertension as well as osteoarthritis, presented to the hospital with 7- 10 days symptoms of cough, congestion, fevers, chills and rigors.  On evaluation, she was found to have left lower lobe pneumonia, mild of hypoxia and dehydration. 1. For community-acquired pneumonia.  She was admitted to the hospital     floor, started on IV Rocephin and azithromycin to cover her  for     community-acquired pathogen.  She had good clinical response.     Subsequently transitioned to p.o. moxifloxacin, she has had     resolution of her leukocytosis as well as fevers.  She also had     blood cultures done which were negative. 2. Suspected underlying COPD.  The patient is not a smoker, however,     has had long-term exposure to tobacco smoke passively from her     husband who was a chronic smoker who smokes regularly at home all     the time as well as previously from her parents.  She will require     PFTs to be done as outpatient to clearly document this because she     does suffer from chronic dyspnea on exertion. 3. Rest of her medical problems remained stable.  DISCHARGE CONDITION:  Stable.  VITAL SIGNS:  Temperature 97.7, pulse 83, blood pressure 148/784, respirations 20, and satting 93% room air.  DISCHARGE LABS:  White count 10.1, hemoglobin 13, and platelet 346.  DISCHARGE FOLLOWUP:  Dr. Eleonore Chiquito, in 1 week.     Zannie Cove, MD     PJ/MEDQ  D:  04/15/2011  T:  04/15/2011  Job:  [id number]  cc:   Gordy Savers, MD  Electronically Signed by Zannie Cove  on  04/16/2011 05:28:30 PM

## 2011-04-16 NOTE — Telephone Encounter (Signed)
Okay to schedule with another doctor.

## 2011-04-16 NOTE — Telephone Encounter (Signed)
Pt has been called and sch for hosp fup with Dr Caryl Never, as noted, since pcp out of office this wk.

## 2011-04-16 NOTE — Telephone Encounter (Signed)
Pt needs a hosp fup ov this wk. Pt was in [facility] hospital re: cough. Pt had chest xray and was having breathing diff. Pt aware that pcp out of office this week. Is req to get hosp fup by another doctor this wk. Pls advise.

## 2011-04-18 ENCOUNTER — Ambulatory Visit (INDEPENDENT_AMBULATORY_CARE_PROVIDER_SITE_OTHER): Payer: Medicare Other | Admitting: Family Medicine

## 2011-04-18 ENCOUNTER — Encounter: Payer: Self-pay | Admitting: Family Medicine

## 2011-04-18 VITALS — BP 130/80 | Temp 98.1°F | Wt 166.0 lb

## 2011-04-18 DIAGNOSIS — J189 Pneumonia, unspecified organism: Secondary | ICD-10-CM

## 2011-04-18 LAB — CULTURE, BLOOD (ROUTINE X 2): Culture  Setup Time: 201208030344

## 2011-04-18 NOTE — Progress Notes (Signed)
  Subjective:    Patient ID: Susan Landry, female    DOB: 09/20/1927, 77 y.o.   MRN: [id number]  HPI Hospital followup. Patient admitted second day of August with cough, tachycardia, and dyspnea. Chest x-ray revealed patchy airspace disease concerning for left middle and lower lobe pneumonia. Blood cultures negative. White blood count elevated on admission at 12,000. Patient treated with Rocephin and Zithromax with prompt improvement. Discharged on moxifloxacin and doing relatively well this time. Appetite improved. Cough has diminished. No fever. Denies nausea or vomiting. Repeat chest x-ray August 5 revealed some bibasilar atelectasis but no comment of infiltrate.  Daughter relates that patient had some dyspnea for years. They suspected possible COPD related to passive nicotine exposure from her parents and her husband.  Previous echocardiogram did not show any significant heart issues. Recommendation to consider pulmonary function testing after patient recovered from pneumonia.  Past Medical History  Diagnosis Date  . HYPERTENSION 07/14/2007  . MILD COGNITIVE IMPAIRMENT SO STATED 07/13/2009  . OSTEOARTHRITIS 01/12/2008  . OVERACTIVE BLADDER 07/14/2008  . Cerebral aneurysm    Past Surgical History  Procedure Date  . Tonsillectomy   . Total knee arthroplasty   . Csf shunt     cerebral aneurysm  . Breast surgery     abcess    reports that she has been passively smoking.  She has never used smokeless tobacco. She reports that she drinks alcohol. She reports that she does not use illicit drugs. family history includes Cancer in her father. Allergies  Allergen Reactions  . Amoxicillin     REACTION: swelling of lips      Review of Systems  Constitutional: Positive for fatigue. Negative for fever and chills.  HENT: Negative for sore throat and postnasal drip.   Respiratory: Positive for shortness of breath. Negative for wheezing.   Cardiovascular: Negative for chest pain and  palpitations.  Gastrointestinal: Negative for abdominal pain.  Genitourinary: Negative for dysuria.  Neurological: Negative for dizziness and headaches.       Objective:   Physical Exam  Constitutional: She is oriented to person, place, and time. She appears well-developed and well-nourished. No distress.  HENT:  Right Ear: External ear normal.  Left Ear: External ear normal.  Mouth/Throat: Oropharynx is clear and moist.  Neck: Neck supple.  Cardiovascular: Normal rate and regular rhythm.   Pulmonary/Chest: Effort normal and breath sounds normal. No respiratory distress. She has no wheezes. She has no rales.  Musculoskeletal: She exhibits no edema.  Lymphadenopathy:    She has no cervical adenopathy.  Neurological: She is alert and oriented to person, place, and time.          Assessment & Plan:  #1 recent community acquired pneumonia clinically improved. Finish out antibiotics. Followup with primary in 2 weeks. #2 chronic dyspnea possibly related to COPD from passive nicotine exposure. Consider spirometry to rule out COPD at followup.    We elected not to obtain today's visit as she is still in acute recovery phase

## 2011-05-09 ENCOUNTER — Encounter: Payer: Self-pay | Admitting: Internal Medicine

## 2011-05-09 ENCOUNTER — Ambulatory Visit (INDEPENDENT_AMBULATORY_CARE_PROVIDER_SITE_OTHER): Payer: Medicare Other | Admitting: Internal Medicine

## 2011-05-09 VITALS — BP 110/88 | Temp 97.7°F | Wt 166.0 lb

## 2011-05-09 DIAGNOSIS — G3184 Mild cognitive impairment, so stated: Secondary | ICD-10-CM

## 2011-05-09 DIAGNOSIS — R0989 Other specified symptoms and signs involving the circulatory and respiratory systems: Secondary | ICD-10-CM

## 2011-05-09 DIAGNOSIS — I10 Essential (primary) hypertension: Secondary | ICD-10-CM

## 2011-05-09 DIAGNOSIS — M1990 Unspecified osteoarthritis, unspecified site: Secondary | ICD-10-CM

## 2011-05-09 NOTE — Patient Instructions (Signed)
Limit your sodium (Salt) intake    It is important that you exercise regularly, at least 20 minutes 3 to 4 times per week.  If you develop chest pain or shortness of breath seek  medical attention.  You need to lose weight.  Consider a lower calorie diet and regular exercise.  Return in 3 months for follow-up  

## 2011-05-09 NOTE — Progress Notes (Signed)
  Subjective:    Patient ID: Susan Landry, female    DOB: 1927/10/21, 77 y.o.   MRN: [id number]  HPI  77 year old patient who is seen today status post hospital discharge for community-acquired pneumonia. She is accompanied by her daughter who is concerned about COPD. The hospitalist raise this issue on 2 occasions while an inpatient apparently based on radiographic findings. The patient does have a history of chronic dyspnea on exertion and also a strong history of passive tobacco exposure. She has never smoked. Denies any chronic cough. Denies any wheezing. She will be relocating to an extended care facility and her daughter is concerned about ability to ambulate long distances to the dining room and other locations. She has some obesity osteoarthritis and some gait instability.  Review of Systems  Constitutional: Negative.   HENT: Negative for hearing loss, congestion, sore throat, rhinorrhea, dental problem, sinus pressure and tinnitus.   Eyes: Negative for pain, discharge and visual disturbance.  Respiratory: Positive for shortness of breath. Negative for cough.   Cardiovascular: Negative for chest pain, palpitations and leg swelling.  Gastrointestinal: Negative for nausea, vomiting, abdominal pain, diarrhea, constipation, blood in stool and abdominal distention.  Genitourinary: Negative for dysuria, urgency, frequency, hematuria, flank pain, vaginal bleeding, vaginal discharge, difficulty urinating, vaginal pain and pelvic pain.  Musculoskeletal: Positive for gait problem. Negative for joint swelling and arthralgias.  Skin: Negative for rash.  Neurological: Negative for dizziness, syncope, speech difficulty, weakness, numbness and headaches.  Hematological: Negative for adenopathy.  Psychiatric/Behavioral: Positive for confusion. Negative for behavioral problems, dysphoric mood and agitation. The patient is not nervous/anxious.        Objective:   Physical Exam  Constitutional: She is  oriented to person, place, and time. She appears well-developed and well-nourished.  HENT:  Head: Normocephalic.  Right Ear: External ear normal.  Left Ear: External ear normal.  Mouth/Throat: Oropharynx is clear and moist.  Eyes: Conjunctivae and EOM are normal. Pupils are equal, round, and reactive to light.  Neck: Normal range of motion. Neck supple. No thyromegaly present.  Cardiovascular: Normal rate, regular rhythm, normal heart sounds and intact distal pulses.   Pulmonary/Chest: Effort normal and breath sounds normal. No respiratory distress. She has no wheezes. She has no rales.       O2 saturation 97  Abdominal: Soft. Bowel sounds are normal. She exhibits no mass. There is no tenderness.  Musculoskeletal: Normal range of motion.  Lymphadenopathy:    She has no cervical adenopathy.  Neurological: She is alert and oriented to person, place, and time.  Skin: Skin is warm and dry. No rash noted.  Psychiatric: She has a normal mood and affect. Her behavior is normal.          Assessment & Plan:

## 2011-05-28 ENCOUNTER — Encounter: Payer: Self-pay | Admitting: Pulmonary Disease

## 2011-05-28 ENCOUNTER — Ambulatory Visit (INDEPENDENT_AMBULATORY_CARE_PROVIDER_SITE_OTHER): Payer: Medicare Other | Admitting: Pulmonary Disease

## 2011-05-28 VITALS — BP 126/78 | HR 75 | Temp 97.7°F | Ht 63.0 in | Wt 166.8 lb

## 2011-05-28 DIAGNOSIS — R0600 Dyspnea, unspecified: Secondary | ICD-10-CM

## 2011-05-28 DIAGNOSIS — R0609 Other forms of dyspnea: Secondary | ICD-10-CM

## 2011-05-28 NOTE — Progress Notes (Signed)
  Subjective:    Patient ID: Susan Landry, female    DOB: 1927/09/22, 77 y.o.   MRN: [id number]  HPI    Review of Systems  Constitutional: Negative for fever and unexpected weight change.  HENT: Negative for ear pain, nosebleeds, congestion, sore throat, rhinorrhea, sneezing, trouble swallowing, dental problem, postnasal drip and sinus pressure.   Eyes: Negative for redness and itching.  Respiratory: Positive for cough and shortness of breath. Negative for chest tightness and wheezing.   Cardiovascular: Negative for palpitations and leg swelling.  Gastrointestinal: Negative for nausea and vomiting.  Genitourinary: Negative for dysuria.  Musculoskeletal: Negative for joint swelling.  Skin: Negative for rash.  Neurological: Negative for headaches.  Hematological: Does not bruise/bleed easily.  Psychiatric/Behavioral: Negative for dysphoric mood. The patient is not nervous/anxious.        Objective:   Physical Exam        Assessment & Plan:

## 2011-06-11 ENCOUNTER — Encounter: Payer: Self-pay | Admitting: Pulmonary Disease

## 2011-06-11 ENCOUNTER — Ambulatory Visit (INDEPENDENT_AMBULATORY_CARE_PROVIDER_SITE_OTHER): Payer: Medicare Other | Admitting: Pulmonary Disease

## 2011-06-11 VITALS — BP 152/74 | HR 82 | Temp 98.0°F | Ht 62.0 in | Wt 171.0 lb

## 2011-06-11 DIAGNOSIS — R0600 Dyspnea, unspecified: Secondary | ICD-10-CM

## 2011-06-11 DIAGNOSIS — R0989 Other specified symptoms and signs involving the circulatory and respiratory systems: Secondary | ICD-10-CM

## 2011-06-11 LAB — PULMONARY FUNCTION TEST

## 2011-06-11 NOTE — Patient Instructions (Signed)
Trial of arcapta one inhalation each am for next 3 weeks.  Call me with your response. Work on weight loss and conditioning. Will send a note to Dr. Pete Glatter.

## 2011-06-11 NOTE — Progress Notes (Signed)
  Subjective:    Patient ID: Susan Landry, female    DOB: 30-May-1928, 77 y.o.   MRN: [id number]  HPI Patient comes in today for followup of her recent PFTs, as part of a workup for dyspnea on exertion.  Her spirometry shows no airflow obstruction or response to bronchodilators, although her lung volumes 2 showed mild to air trapping and her flow volume loop is suggestive of occult obstruction.  She has no restriction, and her diffusion capacity is moderately reduced but corrects for alveolar volume adjustment.  I have reviewed the studies in detail with the patient and her family, and have answered all of their questions.   Review of Systems  Constitutional: Negative for fever and unexpected weight change.  HENT: Positive for rhinorrhea and postnasal drip. Negative for ear pain, nosebleeds, congestion, sore throat, sneezing, trouble swallowing, dental problem and sinus pressure.   Eyes: Negative for redness and itching.  Respiratory: Positive for cough, shortness of breath and wheezing. Negative for chest tightness.   Cardiovascular: Negative for palpitations and leg swelling.  Gastrointestinal: Negative for nausea and vomiting.  Genitourinary: Negative for dysuria.  Musculoskeletal: Negative for joint swelling.  Skin: Negative for rash.  Neurological: Negative for headaches.  Hematological: Does not bruise/bleed easily.  Psychiatric/Behavioral: Negative for dysphoric mood. The patient is not nervous/anxious.        Objective:   Physical Exam Overweight female in no acute distress Nose without purulence or discharge noted Lower extremities with varicosities, and ankle edema.  No cyanosis noted Alert and oriented, moves all 4 extremities.       Assessment & Plan:

## 2011-06-11 NOTE — Assessment & Plan Note (Signed)
The patient has subtle airflow obstruction on her PFTs today indicative by air trapping and her flow volume loop.  However, this is not overly significant.  It is unclear whether this is contributing to her dyspnea on exertion, but I am willing to give her a trial of a LABA to see if she notices a difference.  This is likely to be senile emphysema, since she has no history to suggest asthma.  I suspect that the ability, weight, and deconditioning are significant issues for her.  We also need to consider a possible cardiac source??, and I will leave the decision regarding a cardiac workup to her primary care physician.  My only other thought is whether she could have chronic VTE, but there is really no history to support this diagnosis.  If she continues to have significant dyspnea on exertion that is unexplained, a ventilation perfusion scan could put this issue to rest.  The patient will let me know how she responds to a 3 week trial of a bronchodilator.

## 2011-06-11 NOTE — Progress Notes (Signed)
PFT done today. 

## 2011-06-28 ENCOUNTER — Telehealth: Payer: Self-pay | Admitting: Pulmonary Disease

## 2011-06-28 NOTE — Telephone Encounter (Signed)
Called and spoke with pt.  Pt recently saw KC on 10/2 and was started on Arcapta and told to call back in 3 weeks.  Pt states she is taking Arcapta every morning and has not noticed any change in her breathing- no better or worse. Pt aware KC out of office next week and is ok to wait for his response.  Will forward message to [facility] as an [place]

## 2011-07-02 ENCOUNTER — Telehealth: Payer: Self-pay | Admitting: Pulmonary Disease

## 2011-07-02 ENCOUNTER — Other Ambulatory Visit: Payer: Self-pay | Admitting: Internal Medicine

## 2011-07-02 DIAGNOSIS — Z1231 Encounter for screening mammogram for malignant neoplasm of breast: Secondary | ICD-10-CM

## 2011-07-02 NOTE — Telephone Encounter (Signed)
07/02/2011 8:18 AM Phone (Incoming) Almira Coaster [phone]  Patient was given a 3 week trial of arcapta. She does not feel like it has helped. What is the next step? Phone message taken by Lambert Keto.     Called, spoke with pt's daughter, Almira Coaster.  States pt has been using the Arcapta x 3 wks as recommended by Dr. Shelle Iron and finished this today.  States pt did not see a difference in her breathing while on this - no better or worse and would like to know what his recs are at this point.  She also states Dr. Shelle Iron had mentioned the possibility of doing a ventilation perfusion scan.  Would like to know if [facility] thinks a high resolution CT Scan would be a better option.  I informed her Jonathon Bellows is out of the office until Monday.  She is ok with this and will call back if anything needed prior to this.  Will forward message to Dr. Shelle Iron, pls advise.  Thanks!

## 2011-07-02 NOTE — Telephone Encounter (Signed)
DAUGHTER RETURNED CALL. SAME #. Hazel Sams

## 2011-07-02 NOTE — Telephone Encounter (Signed)
LMOMTCB x 1 

## 2011-07-02 NOTE — Telephone Encounter (Signed)
Duplicate phone message - message created regarding this on 06/28/11 when pt called in regarding this.  Pls see that phone message for additional information.

## 2011-07-03 NOTE — Telephone Encounter (Signed)
Called and spoke with pt.  Informed her of KC's recs.  Pt verbalized understanding and stated she wanted to "think about it" first regarding going ahead with a VQ scan or not and stated she will call us back with her response.  Will wait for pt to call back.

## 2011-07-03 NOTE — Telephone Encounter (Signed)
Let her know that if the medication did not help, then the mild airflow obstruction on her breathing tests is unlikely to be an issue for her.  No need to put on any other medication.  Would recommend VQ scan not ct, since ct doesn't pick up small chronic clot.  If this is negative, really no other pulmonary recommendations except improved conditioning.  Her primary md would then need to decide about further cardiac workup.   Go ahead and order v/q to r/o chronic VTE if ok with pt and daughter.  Thanks.  Will review results when I get back in town.

## 2011-07-08 NOTE — Telephone Encounter (Signed)
Noted  

## 2011-07-16 ENCOUNTER — Other Ambulatory Visit (INDEPENDENT_AMBULATORY_CARE_PROVIDER_SITE_OTHER): Payer: Medicare Other

## 2011-07-16 DIAGNOSIS — N318 Other neuromuscular dysfunction of bladder: Secondary | ICD-10-CM

## 2011-07-16 DIAGNOSIS — I10 Essential (primary) hypertension: Secondary | ICD-10-CM

## 2011-07-16 DIAGNOSIS — G3184 Mild cognitive impairment, so stated: Secondary | ICD-10-CM

## 2011-07-16 DIAGNOSIS — Z0000 Encounter for general adult medical examination without abnormal findings: Secondary | ICD-10-CM

## 2011-07-16 DIAGNOSIS — Z79899 Other long term (current) drug therapy: Secondary | ICD-10-CM

## 2011-07-16 DIAGNOSIS — R0609 Other forms of dyspnea: Secondary | ICD-10-CM

## 2011-07-16 LAB — CBC WITH DIFFERENTIAL/PLATELET
Basophils Relative: 0.8 % (ref 0.0–3.0)
Eosinophils Relative: 3.6 % (ref 0.0–5.0)
HCT: 45.5 % (ref 36.0–46.0)
Hemoglobin: 15.2 g/dL — ABNORMAL HIGH (ref 12.0–15.0)
Lymphs Abs: 1.9 10*3/uL (ref 0.7–4.0)
MCV: 95.1 fl (ref 78.0–100.0)
Monocytes Absolute: 0.5 10*3/uL (ref 0.1–1.0)
Monocytes Relative: 7.6 % (ref 3.0–12.0)
Neutro Abs: 3.9 10*3/uL (ref 1.4–7.7)
Platelets: 236 10*3/uL (ref 150.0–400.0)
WBC: 6.6 10*3/uL (ref 4.5–10.5)

## 2011-07-16 LAB — BASIC METABOLIC PANEL
BUN: 20 mg/dL (ref 6–23)
Chloride: 103 mEq/L (ref 96–112)
GFR: 81.21 mL/min (ref >60.00)
Potassium: 3.8 mEq/L (ref 3.5–5.1)
Sodium: 140 mEq/L (ref 135–145)

## 2011-07-16 LAB — POCT URINALYSIS DIPSTICK
Bilirubin, UA: NEGATIVE
Blood, UA: NEGATIVE
Glucose, UA: NEGATIVE
Spec Grav, UA: 1.015
Urobilinogen, UA: 0.2

## 2011-07-16 LAB — HEPATIC FUNCTION PANEL
ALT: 26 U/L (ref 0–35)
AST: 22 U/L (ref 0–37)
Albumin: 4.3 g/dL (ref 3.5–5.2)
Total Bilirubin: 1 mg/dL (ref 0.3–1.2)
Total Protein: 7.3 g/dL (ref 6.0–8.3)

## 2011-07-16 LAB — LIPID PANEL
Cholesterol: 213 mg/dL — ABNORMAL HIGH (ref 0–200)
VLDL: 13.4 mg/dL (ref 0.0–40.0)

## 2011-07-16 LAB — TSH: TSH: 1.89 u[IU]/mL (ref 0.35–5.50)

## 2011-07-17 ENCOUNTER — Telehealth: Payer: Self-pay | Admitting: Pulmonary Disease

## 2011-07-17 NOTE — Telephone Encounter (Signed)
I spoke with the pt's daughter, Susan Landry and she will speak with the pt to see if she has made a decision regarding having the VQ scan as suggested by Dr. Shelle Iron. The daughter will call back to let us know.

## 2011-07-22 ENCOUNTER — Ambulatory Visit (HOSPITAL_COMMUNITY)
Admission: RE | Admit: 2011-07-22 | Discharge: 2011-07-22 | Disposition: A | Discharge status: Home / Self Care | Payer: Medicare Other | Source: Ambulatory Visit | Attending: Internal Medicine | Admitting: Internal Medicine

## 2011-07-22 DIAGNOSIS — Z1231 Encounter for screening mammogram for malignant neoplasm of breast: Secondary | ICD-10-CM | POA: Insufficient documentation

## 2011-07-23 ENCOUNTER — Encounter: Payer: Self-pay | Admitting: Internal Medicine

## 2011-07-23 ENCOUNTER — Ambulatory Visit (INDEPENDENT_AMBULATORY_CARE_PROVIDER_SITE_OTHER): Payer: Medicare Other | Admitting: Internal Medicine

## 2011-07-23 DIAGNOSIS — N318 Other neuromuscular dysfunction of bladder: Secondary | ICD-10-CM

## 2011-07-23 DIAGNOSIS — G3184 Mild cognitive impairment, so stated: Secondary | ICD-10-CM

## 2011-07-23 DIAGNOSIS — I10 Essential (primary) hypertension: Secondary | ICD-10-CM

## 2011-07-23 DIAGNOSIS — R0989 Other specified symptoms and signs involving the circulatory and respiratory systems: Secondary | ICD-10-CM

## 2011-07-23 DIAGNOSIS — M1990 Unspecified osteoarthritis, unspecified site: Secondary | ICD-10-CM

## 2011-07-23 DIAGNOSIS — Z0000 Encounter for general adult medical examination without abnormal findings: Secondary | ICD-10-CM

## 2011-07-23 LAB — BRAIN NATRIURETIC PEPTIDE: Pro B Natriuretic peptide (BNP): 39 pg/mL (ref 0.0–100.0)

## 2011-07-23 NOTE — Progress Notes (Signed)
Subjective:    Patient ID: Susan Landry, female    DOB: September 02, 1928, 77 y.o.   MRN: [id number]  HPI  History of Present Illness:   45 -year-old history for a comprehensive evaluation. problems include mild cognitive impairment. She has been  on medication for an overactive bladder and has treated hypertension. She has osteoarthritis and is status post right total knee replacement surgery. She has done well recently and denies any cardiopulmonary complaints dyspnea on exertion. She was hospitalized in August for community-acquired pneumonia. She has been followed by pulmonary and has had pulmonary function studies. Denies any exertional chest pain.  During the August hospitalization  did have a BNP;  pulmonary medicine had suggested a VQ lung scan  Here for Medicare AWV:   1. Risk factors based on Past M, S, F history: cardiovascular risk factors include hypertension. She has no other cardiopulmonary risk factors. She is a lifelong nonsmoker  2. Physical Activities: participates with water aerobics 3 times weekly  3. Depression/mood: no history of depression  4. Hearing: no deficits  5. ADL's: independent in all aspects of daily living  6. Fall Risk: low fall risk  7. Home Safety: no issues identified  8. Height, weight, &visual acuity: height and weight are stable. Visual acuity is normal. Has had a recent eye examination  9. Counseling: more vigorous exercise and attempt at weight loss encouraged  10. Labs ordered based on risk factors: complete laboratory profile, including lipid profile, and TSH were reviewed  11. Referral Coordination- non-applicable  12. Care Plan- more rigorous exercise regimen attempt at weight loss encouraged  13. Cognitive Assessment- patient has mild cognitive impairment, which has been stable on Aricept   Preventive Screening-Counseling & Management  Alcohol-Tobacco  Smoking Status: never  Allergies (verified):  No Known Drug Allergies   Past History:    Past Medical History:  Reviewed history from 01/12/2009 and no changes required.  Hypertension 2003  Osteoarthritis  history of cerebral aneurysm  cognitive impairment  overactive bladder   Past Surgical History:  Reviewed history from 07/14/2008 and no changes required.  remote breast abscess  1996, status post VP shunt for cerebral aneurysm  status post right TKR  Total knee replacement  Tonsillectomy  colonoscopy 2007   Social History:  Reviewed history from 07/14/2008 and no changes required.  Married  Never Smoked  4 children 3 daughters       Review of Systems  Constitutional: Negative for fever, appetite change, fatigue and unexpected weight change.  HENT: Negative for hearing loss, ear pain, nosebleeds, congestion, sore throat, mouth sores, trouble swallowing, neck stiffness, dental problem, voice change, sinus pressure and tinnitus.   Eyes: Negative for photophobia, pain, redness and visual disturbance.  Respiratory: Positive for shortness of breath (shortness of breath with exertion). Negative for cough and chest tightness.   Cardiovascular: Negative for chest pain, palpitations and leg swelling.  Gastrointestinal: Negative for nausea, vomiting, abdominal pain, diarrhea, constipation, blood in stool, abdominal distention and rectal pain.  Genitourinary: Negative for dysuria, urgency, frequency, hematuria, flank pain, vaginal bleeding, vaginal discharge, difficulty urinating, genital sores, vaginal pain, menstrual problem and pelvic pain.  Musculoskeletal: Negative for back pain and arthralgias.  Skin: Negative for rash.  Neurological: Negative for dizziness, syncope, speech difficulty, weakness, light-headedness, numbness and headaches.  Hematological: Negative for adenopathy. Does not bruise/bleed easily.  Psychiatric/Behavioral: Negative for suicidal ideas, behavioral problems, self-injury, dysphoric mood and agitation. The patient is not nervous/anxious.  Objective:   Physical Exam  Constitutional: She is oriented to person, place, and time. She appears well-developed and well-nourished.  HENT:  Head: Normocephalic and atraumatic.  Right Ear: External ear normal.  Left Ear: External ear normal.  Mouth/Throat: Oropharynx is clear and moist.  Eyes: Conjunctivae and EOM are normal.  Neck: Normal range of motion. Neck supple. No JVD present. No thyromegaly present.  Cardiovascular: Normal rate, regular rhythm and normal heart sounds.   No murmur heard. Pulmonary/Chest: Effort normal and breath sounds normal. She has no wheezes. She has no rales.       O2 saturation 91%  Abdominal: Soft. Bowel sounds are normal. She exhibits no distension and no mass. There is no tenderness. There is no rebound and no guarding.  Genitourinary: Vagina normal.  Musculoskeletal: Normal range of motion. She exhibits edema. She exhibits no tenderness.       Trace pedal edema  Neurological: She is alert and oriented to person, place, and time. She has normal reflexes. No cranial nerve deficit. She exhibits normal muscle tone. Coordination normal.  Skin: Skin is warm and dry. No rash noted.  Psychiatric: She has a normal mood and affect. Her behavior is normal.          Assessment & Plan:   Preventive health examination Dyspnea on exertion. We'll check a d-dimer and BNP. Followup pulmonary medicine Moderate cognitive impairment Hypertension stable  We'll continue present regimen

## 2011-07-23 NOTE — Patient Instructions (Signed)
Limit your sodium (Salt) intake    It is important that you exercise regularly, at least 20 minutes 3 to 4 times per week.  If you develop chest pain or shortness of breath seek  medical attention.  Return in 3 months for follow-up  Pulmonary medicine followup

## 2011-07-24 NOTE — Progress Notes (Signed)
Quick Note:  Attempt to call- ans mach - LMTCB if questions lab normal - call if sx dont improve ______

## 2011-07-24 NOTE — Telephone Encounter (Signed)
LMOMTCB x 1 

## 2011-07-25 NOTE — Telephone Encounter (Signed)
[email or web address]  

## 2011-07-26 NOTE — Telephone Encounter (Signed)
Spoke with Gina-daughter to patient-she states patient was recently seen by Dr Amador Cunas on 07-23-2011; had d-dimer checked which was said to be normal and is unsure if VQ scan is still needed. I informed Almira Coaster I would forward the message to [facility] to address when he returns to the office Monday and would advise then.

## 2011-07-29 ENCOUNTER — Telehealth: Payer: Self-pay | Admitting: *Deleted

## 2011-07-29 NOTE — Telephone Encounter (Signed)
Let them know d-dimer is a good test for acute pulmonary embolus, but not chronic thromboembolic disease.  If she would like to complete workup, test needs to be done.

## 2011-07-29 NOTE — Telephone Encounter (Signed)
Please call.

## 2011-07-29 NOTE — Telephone Encounter (Signed)
I spoke with Susan Landry and she is aware of kc's recs. She verbalized understanding and states will discuss this with family

## 2011-07-29 NOTE — Telephone Encounter (Signed)
Attempt to call - VM - left msg that I will try later in between pt - will try after pts this afternoon

## 2011-07-29 NOTE — Telephone Encounter (Signed)
Daughter would like to speak to Selena Batten re: lab work on her Mom, please.

## 2011-07-30 NOTE — Telephone Encounter (Signed)
Attempt to call again - Vm

## 2011-10-23 ENCOUNTER — Ambulatory Visit: Payer: Medicare Other | Admitting: Internal Medicine

## 2011-11-21 ENCOUNTER — Other Ambulatory Visit (HOSPITAL_COMMUNITY): Payer: Self-pay | Admitting: Geriatric Medicine

## 2011-11-21 DIAGNOSIS — R0602 Shortness of breath: Secondary | ICD-10-CM

## 2011-11-21 DIAGNOSIS — R0600 Dyspnea, unspecified: Secondary | ICD-10-CM

## 2011-11-22 ENCOUNTER — Other Ambulatory Visit (HOSPITAL_COMMUNITY): Payer: Medicare Other

## 2011-11-22 ENCOUNTER — Inpatient Hospital Stay (HOSPITAL_COMMUNITY): Admission: RE | Admit: 2011-11-22 | Payer: Medicare Other | Source: Ambulatory Visit

## 2012-02-18 ENCOUNTER — Other Ambulatory Visit: Payer: Self-pay | Admitting: Internal Medicine

## 2012-06-29 ENCOUNTER — Other Ambulatory Visit: Payer: Self-pay | Admitting: Geriatric Medicine

## 2012-08-28 ENCOUNTER — Other Ambulatory Visit: Payer: Self-pay | Admitting: Internal Medicine

## 2013-10-03 ENCOUNTER — Other Ambulatory Visit (HOSPITAL_COMMUNITY): Payer: Self-pay | Admitting: Geriatric Medicine

## 2013-10-03 ENCOUNTER — Inpatient Hospital Stay (HOSPITAL_COMMUNITY)
Admission: EM | Admit: 2013-10-03 | Discharge: 2013-10-09 | DRG: 291 | Disposition: A | Discharge status: Home Health | Payer: Medicare Other | Attending: Internal Medicine | Admitting: Internal Medicine

## 2013-10-03 ENCOUNTER — Inpatient Hospital Stay (HOSPITAL_COMMUNITY): Admission: RE | Admit: 2013-10-03 | Payer: Medicare Other | Source: Ambulatory Visit

## 2013-10-03 ENCOUNTER — Encounter (HOSPITAL_COMMUNITY): Payer: Self-pay | Admitting: Emergency Medicine

## 2013-10-03 ENCOUNTER — Emergency Department (HOSPITAL_COMMUNITY): Payer: Medicare Other

## 2013-10-03 DIAGNOSIS — I5031 Acute diastolic (congestive) heart failure: Principal | ICD-10-CM

## 2013-10-03 DIAGNOSIS — M1990 Unspecified osteoarthritis, unspecified site: Secondary | ICD-10-CM

## 2013-10-03 DIAGNOSIS — J111 Influenza due to unidentified influenza virus with other respiratory manifestations: Secondary | ICD-10-CM | POA: Diagnosis present

## 2013-10-03 DIAGNOSIS — J209 Acute bronchitis, unspecified: Secondary | ICD-10-CM

## 2013-10-03 DIAGNOSIS — R0902 Hypoxemia: Secondary | ICD-10-CM

## 2013-10-03 DIAGNOSIS — J45901 Unspecified asthma with (acute) exacerbation: Secondary | ICD-10-CM

## 2013-10-03 DIAGNOSIS — W19XXXA Unspecified fall, initial encounter: Secondary | ICD-10-CM

## 2013-10-03 DIAGNOSIS — Z7982 Long term (current) use of aspirin: Secondary | ICD-10-CM

## 2013-10-03 DIAGNOSIS — R32 Unspecified urinary incontinence: Secondary | ICD-10-CM | POA: Diagnosis present

## 2013-10-03 DIAGNOSIS — G3184 Mild cognitive impairment, so stated: Secondary | ICD-10-CM | POA: Diagnosis present

## 2013-10-03 DIAGNOSIS — J441 Chronic obstructive pulmonary disease with (acute) exacerbation: Secondary | ICD-10-CM | POA: Diagnosis present

## 2013-10-03 DIAGNOSIS — I498 Other specified cardiac arrhythmias: Secondary | ICD-10-CM | POA: Diagnosis present

## 2013-10-03 DIAGNOSIS — W06XXXA Fall from bed, initial encounter: Secondary | ICD-10-CM | POA: Diagnosis present

## 2013-10-03 DIAGNOSIS — R5383 Other fatigue: Secondary | ICD-10-CM

## 2013-10-03 DIAGNOSIS — IMO0002 Reserved for concepts with insufficient information to code with codable children: Secondary | ICD-10-CM | POA: Diagnosis present

## 2013-10-03 DIAGNOSIS — N318 Other neuromuscular dysfunction of bladder: Secondary | ICD-10-CM

## 2013-10-03 DIAGNOSIS — I10 Essential (primary) hypertension: Secondary | ICD-10-CM

## 2013-10-03 DIAGNOSIS — J9801 Acute bronchospasm: Secondary | ICD-10-CM | POA: Diagnosis present

## 2013-10-03 DIAGNOSIS — Z66 Do not resuscitate: Secondary | ICD-10-CM | POA: Diagnosis present

## 2013-10-03 DIAGNOSIS — J9601 Acute respiratory failure with hypoxia: Secondary | ICD-10-CM | POA: Diagnosis present

## 2013-10-03 DIAGNOSIS — R5381 Other malaise: Secondary | ICD-10-CM | POA: Diagnosis present

## 2013-10-03 DIAGNOSIS — E876 Hypokalemia: Secondary | ICD-10-CM | POA: Diagnosis present

## 2013-10-03 DIAGNOSIS — R0609 Other forms of dyspnea: Secondary | ICD-10-CM

## 2013-10-03 DIAGNOSIS — R062 Wheezing: Secondary | ICD-10-CM

## 2013-10-03 DIAGNOSIS — J9600 Acute respiratory failure, unspecified whether with hypoxia or hypercapnia: Secondary | ICD-10-CM | POA: Diagnosis present

## 2013-10-03 DIAGNOSIS — Z96659 Presence of unspecified artificial knee joint: Secondary | ICD-10-CM

## 2013-10-03 DIAGNOSIS — S00419A Abrasion of unspecified ear, initial encounter: Secondary | ICD-10-CM | POA: Diagnosis present

## 2013-10-03 DIAGNOSIS — F0390 Unspecified dementia without behavioral disturbance: Secondary | ICD-10-CM | POA: Diagnosis present

## 2013-10-03 LAB — CBC WITH DIFFERENTIAL/PLATELET
BASOS ABS: 0 10*3/uL (ref 0.0–0.1)
Basophils Relative: 1 % (ref 0–1)
EOS ABS: 0 10*3/uL (ref 0.0–0.7)
Eosinophils Relative: 0 % (ref 0–5)
HCT: 41 % (ref 36.0–46.0)
Hemoglobin: 13.7 g/dL (ref 12.0–15.0)
LYMPHS ABS: 0.9 10*3/uL (ref 0.7–4.0)
LYMPHS PCT: 34 % (ref 12–46)
MCH: 30.5 pg (ref 26.0–34.0)
MCHC: 33.4 g/dL (ref 30.0–36.0)
MCV: 91.3 fL (ref 78.0–100.0)
Monocytes Absolute: 0.4 10*3/uL (ref 0.1–1.0)
Monocytes Relative: 14 % — ABNORMAL HIGH (ref 3–12)
NEUTROS PCT: 51 % (ref 43–77)
Neutro Abs: 1.4 10*3/uL — ABNORMAL LOW (ref 1.7–7.7)
PLATELETS: 178 10*3/uL (ref 150–400)
RBC: 4.49 MIL/uL (ref 3.87–5.11)
RDW: 14.2 % (ref 11.5–15.5)
WBC: 2.6 10*3/uL — AB (ref 4.0–10.5)

## 2013-10-03 LAB — COMPREHENSIVE METABOLIC PANEL
ALK PHOS: 80 U/L (ref 39–117)
ALT: 23 U/L (ref 0–35)
AST: 27 U/L (ref 0–37)
Albumin: 3.6 g/dL (ref 3.5–5.2)
BUN: 16 mg/dL (ref 6–23)
CALCIUM: 8.5 mg/dL (ref 8.4–10.5)
CO2: 29 meq/L (ref 19–32)
Chloride: 97 mEq/L (ref 96–112)
Creatinine, Ser: 0.76 mg/dL (ref 0.50–1.10)
GFR calc non Af Amer: 76 mL/min — ABNORMAL LOW (ref >90)
GFR, EST AFRICAN AMERICAN: 88 mL/min — AB (ref >90)
Glucose, Bld: 88 mg/dL (ref 70–99)
Potassium: 3.8 mEq/L (ref 3.7–5.3)
SODIUM: 138 meq/L (ref 137–147)
TOTAL PROTEIN: 6.8 g/dL (ref 6.0–8.3)
Total Bilirubin: 0.2 mg/dL — ABNORMAL LOW (ref 0.3–1.2)

## 2013-10-03 LAB — PRO B NATRIURETIC PEPTIDE: Pro B Natriuretic peptide (BNP): 55.4 pg/mL (ref 0–450)

## 2013-10-03 LAB — CK: Total CK: 191 U/L — ABNORMAL HIGH (ref 7–177)

## 2013-10-03 LAB — TROPONIN I

## 2013-10-03 MED ORDER — METHYLPREDNISOLONE SODIUM SUCC 125 MG IJ SOLR
125.0000 mg | Freq: Once | INTRAMUSCULAR | Status: AC
  Administered 2013-10-03: 125 mg via INTRAVENOUS
  Filled 2013-10-03: qty 2

## 2013-10-03 MED ORDER — ALBUTEROL SULFATE (2.5 MG/3ML) 0.083% IN NEBU
5.0000 mg | INHALATION_SOLUTION | Freq: Once | RESPIRATORY_TRACT | Status: AC
Start: 2013-10-03 — End: 2013-10-03
  Administered 2013-10-03: 5 mg via RESPIRATORY_TRACT
  Filled 2013-10-03: qty 6

## 2013-10-03 MED ORDER — IPRATROPIUM-ALBUTEROL 0.5-2.5 (3) MG/3ML IN SOLN
3.0000 mL | Freq: Once | RESPIRATORY_TRACT | Status: AC
  Administered 2013-10-03: 3 mL via RESPIRATORY_TRACT
  Filled 2013-10-03: qty 3

## 2013-10-03 MED ORDER — ALBUTEROL (5 MG/ML) CONTINUOUS INHALATION SOLN
15.0000 mg/h | INHALATION_SOLUTION | Freq: Once | RESPIRATORY_TRACT | Status: AC
  Administered 2013-10-03: 15 mg/h via RESPIRATORY_TRACT
  Filled 2013-10-03: qty 20

## 2013-10-03 NOTE — ED Provider Notes (Signed)
CSN: [id number]     Arrival date & time 10/03/13  1725 History   First MD Initiated Contact with Patient 10/03/13 1755     Chief Complaint  Patient presents with  . Fall  . Blood from Ears    (Consider location/radiation/quality/duration/timing/severity/associated sxs/prior Treatment) Patient is a 80 y.o. female presenting with fall. The history is provided by the patient.  Fall This is a new problem. The current episode started yesterday. Episode frequency: once. The problem has been resolved. Pertinent negatives include no chest pain, no abdominal pain, no headaches and no shortness of breath. Nothing aggravates the symptoms. Nothing relieves the symptoms. She has tried nothing for the symptoms. The treatment provided significant relief.    Past Medical History  Diagnosis Date  . HYPERTENSION 07/14/2007  . MILD COGNITIVE IMPAIRMENT SO STATED 07/13/2009  . OSTEOARTHRITIS 01/12/2008  . OVERACTIVE BLADDER 07/14/2008  . Cerebral aneurysm    Past Surgical History  Procedure Laterality Date  . Tonsillectomy    . Total knee arthroplasty    . Csf shunt      cerebral aneurysm  . Breast surgery      abcess   Family History  Problem Relation Age of Onset  . Cancer Father    History  Substance Use Topics  . Smoking status: Never Smoker   . Smokeless tobacco: Never Used  . Alcohol Use: Yes   OB History   Grav Para Term Preterm Abortions TAB SAB Ect Mult Living                 Review of Systems  Constitutional: Negative for fever and fatigue.  HENT: Negative for congestion and drooling.   Eyes: Negative for pain.  Respiratory: Positive for cough. Negative for shortness of breath.   Cardiovascular: Negative for chest pain.  Gastrointestinal: Negative for nausea, vomiting, abdominal pain and diarrhea.  Genitourinary: Negative for dysuria and hematuria.  Musculoskeletal: Negative for back pain, gait problem and neck pain.  Skin: Negative for color change.  Neurological: Negative  for dizziness and headaches.       Fall  Hematological: Negative for adenopathy.  Psychiatric/Behavioral: Negative for behavioral problems.  All other systems reviewed and are negative.    Allergies  Amoxicillin  Home Medications   Current Outpatient Rx  Name  Route  Sig  Dispense  Refill  . ARICEPT 10 MG tablet      TAKE 1 TABLET ONCE DAILY.   90 each   1   . aspirin 325 MG tablet   Oral   Take 325 mg by mouth daily.           . Calcium Carbonate-Vitamin D (CALCIUM 600 + D PO)   Oral   Take by mouth daily.           . clindamycin (CLEOCIN) 150 MG capsule      as directed. Prior to dental work.          . fosinopril (MONOPRIL) 20 MG tablet   Oral   Take 1 tablet (20 mg total) by mouth daily.   90 tablet   6   . Multiple Vitamin (MULTIVITAMIN) tablet   Oral   Take 1 tablet by mouth daily.           Marland Kitchen. PROAIR HFA 108 (90 BASE) MCG/ACT inhaler   Inhalation   Inhale 2 puffs into the lungs every 6 (six) hours as needed.           BP 122/93  Pulse 84  Temp(Src) 98.4 F (36.9 C) (Oral)  Resp 16  SpO2 89% Physical Exam  Nursing note and vitals reviewed. Constitutional: She is oriented to person, place, and time. She appears well-developed and well-nourished.  HENT:  Head: Normocephalic.  Mouth/Throat: Oropharynx is clear and moist. No oropharyngeal exudate.  Mild tenderness to palpation of the right pre-auricular area.  Normal appearing left tympanic membrane.  No obvious deformity to the right tympanic membrane. There is a small amount of dry blood in the right ear canal and a small blood clot obscures the most anterior portion of the right tympanic membrane. There is a small abrasion to the tragus of the right ear.  Eyes: Conjunctivae and EOM are normal. Pupils are equal, round, and reactive to light.  Neck: Normal range of motion. Neck supple.  No vertebral tenderness to palpation.  Cardiovascular: Normal rate, regular rhythm, normal heart  sounds and intact distal pulses.  Exam reveals no gallop and no friction rub.   No murmur heard. Pulmonary/Chest: Effort normal. No respiratory distress. She has wheezes.  Diffuse mild expiratory wheezing.  Abdominal: Soft. Bowel sounds are normal. There is no tenderness. There is no rebound and no guarding.  Musculoskeletal: Normal range of motion. She exhibits no edema and no tenderness.  Normal strength in all extremities. No focal tenderness to palpation of the hips. Normal range of motion of the hips bilaterally.  Neurological: She is alert and oriented to person, place, and time. She has normal strength. No cranial nerve deficit or sensory deficit. Coordination and gait normal.  Normal finger to nose bilaterally.  The patient normally ambulates with a cane but is able to walk forwards and backwards with minimal assistance by me.  Skin: Skin is warm and dry.  Psychiatric: She has a normal mood and affect. Her behavior is normal.    ED Course  Procedures (including critical care time) Labs Review Labs Reviewed  CBC WITH DIFFERENTIAL - Abnormal; Notable for the following:    WBC 2.6 (*)    Neutro Abs 1.4 (*)    Monocytes Relative 14 (*)    All other components within normal limits  COMPREHENSIVE METABOLIC PANEL - Abnormal; Notable for the following:    Total Bilirubin 0.2 (*)    GFR calc non Af Amer 76 (*)    GFR calc Af Amer 88 (*)    All other components within normal limits  CK - Abnormal; Notable for the following:    Total CK 191 (*)    All other components within normal limits  PRO B NATRIURETIC PEPTIDE  TROPONIN I   Imaging Review Dg Chest 2 View  10/03/2013   CLINICAL DATA:  Cough and wheezing.  EXAM: CHEST  2 VIEW  COMPARISON:  04/13/2013, 04/10/2013 and 12/25/2004 Heart size is normal. Main pulmonary arteries are chronically slightly prominent. Chronic elevation of the right hemidiaphragm. There is slight scarring at the left lung base laterally. There is slight  peribronchial thickening in and atelectasis at the right lung base.  Ventriculoperitoneal shunt tube is noted crossing the right side of the chest. Thoracolumbar scoliosis, unchanged. No acute osseous abnormality.  FINDINGS: Bronchitic changes in slight atelectasis on the right.  IMPRESSION: No active cardiopulmonary disease.   Electronically Signed   By: Geanie Cooley M.D.   On: 10/03/2013 18:53   Ct Head Wo Contrast  10/03/2013   CLINICAL DATA:  Fall.  History of aneurysm.  EXAM: CT HEAD WITHOUT CONTRAST  TECHNIQUE: Contiguous axial images were  obtained from the base of the skull through the vertex without intravenous contrast.  COMPARISON:  04/05/2010.  FINDINGS: No skull fracture or intracranial hemorrhage.  Remote right temporal craniectomy with placement of aneurysm clip right cavernous sinus region. Encephalomalacia right temporal lobe felt to be related to the operative approach.  Remote left parietal-occipital lobe infarct.  Small vessel disease type changes without CT evidence of large acute infarct.  Ventricular shunt enters from the right frontal region with tip in the right frontal horn. No evidence of hydrocephalus.  No intracranial mass lesion noted on this unenhanced exam.  Mastoid air cells, middle ear cavities and visualized paranasal sinuses are clear.  IMPRESSION: Postoperative changes as noted above without evidence of acute fracture or intracranial hemorrhage.   Electronically Signed   By: Bridgett Larsson M.D.   On: 10/03/2013 18:47    EKG Interpretation    Date/Time:  Sunday October 03 2013 19:35:15 EST Ventricular Rate:  94 PR Interval:  165 QRS Duration: 94 QT Interval:  390 QTC Calculation: 488 R Axis:   -48 Text Interpretation:  Sinus rhythm Left axis deviation Low voltage, precordial leads RSR' in V1 or V2, probably normal variant Borderline prolonged QT interval No significant change since last tracing Confirmed by Marci Polito  MD, Toua Stites ([id number]) on 10/03/2013 7:40:13  PM            MDM   1. Fall   2. Ear abrasion   3. Hypoxia   4. Wheezing     6:37 PM 80 y.o. female who presents with a fall which occurred yesterday evening at approximately 9 PM. The patient states that she was reaching over to her bedside table and fell from the bed hitting the right pre-auricular area of her head. She denies any loss of consciousness. She was unable to get herself up off the ground until she was found around 8 AM next morning. There was some very mild blood noted around the right external auditory meatus. She is afebrile and vital signs are unremarkable here. She has no complaints on exam and denies any headache. She has some mild tenderness to palpation of the right pre-auricular area. She has no focal neurologic deficits. She does have some mild wheezing and has had a cough for the last few days. Triage vitals note pt was hypoxic, but she denies sob. Will give albuterol d/t wheezing and get screening imaging and labs.  Gave alb neb x 1, duo neb x 1. Pt remains hypoxic (88% on RA). Gave 1 hr long albuterol neb and pt remains hypoxic (still 88% on RA). Wheezing has improved significantly and I did give 125mg  solumedrol. Will admit d/t hypoxia. Consulted hospitalist for admission.     Junius Argyle, MD 10/03/13 2340

## 2013-10-03 NOTE — ED Notes (Signed)
Pt states that she fell out of bed yesterday and hit her head on the corner of a nightstand on the side that she noticed dried blood today.  Hx of cerebral aneurysm. Pt's family states she has not been confused.  A&O.  Denies LOC.  o2 sats 89% on RA.  Denies breathing issues.

## 2013-10-03 NOTE — H&P (Signed)
[facility]  History and Physical Susan Landry L. Link Snuffer, MD Pager [phone] (if 7P to 7A, page night hospitalist on amion.Susan Landry AVW:[id number] DOB: Oct 09, 1927 DOA: 10/03/2013  Referring physician: ED  PCP: Ginette Otto, MD   Chief Complaint: hypoxia , fall   HPI:  Pt w/ hx of occult obstructive lung disease, cerebral aneurysm s/p clip, CSF shunt, HTN who presents after a fall the night prior. Upon questioning the patient has been having persistant wheezing for the past few weeks. Over the past few days this has developed into a dry cough. Denies fevers, chills, body aches. Then last night she was reaching for something at the night stand and fell off bed. Hit temple on the night stand. Was then too weak to get up so she laid on the floor until found by husband this AM. She denies LOC, HA, or focal deficits.   In the ED, CT head unremarkable for acute findings. CXR unremarkable. Labs only significant for mildly elevated CK w/ normal GFR and leukopenia. Her vitals significant for sating 88% on RA that improved on 4L [place] to > 92%. Also sinus tachycardia after receiving 125mg  IV solumedrol and extensive albuterol neb treatments.   Patient was lucid and oriented and requested to be DO NOT RESUSCITATE   Chart Review:  Patient was seen by pulmonology in 2012 and diagnosed w/ occult obstructive lung disease   Review of Systems:  Neg except as noted above   Past Medical History  Diagnosis Date  . HYPERTENSION 07/14/2007  . MILD COGNITIVE IMPAIRMENT SO STATED 07/13/2009  . OSTEOARTHRITIS 01/12/2008  . OVERACTIVE BLADDER 07/14/2008  . Cerebral aneurysm     Past Surgical History  Procedure Laterality Date  . Tonsillectomy    . Total knee arthroplasty    . Csf shunt      cerebral aneurysm  . Breast surgery      abcess    Social History:  reports that she has never smoked. She has never used smokeless tobacco. She reports that she drinks alcohol. She reports that she  does not use illicit drugs.  No Known Allergies  Family History  Problem Relation Age of Onset  . Cancer Father      Prior to Admission medications   Medication Sig Start Date End Date Taking? Authorizing Provider  aspirin 325 MG tablet Take 325 mg by mouth daily.     Yes Historical Provider, MD  Calcium Carbonate-Vitamin D (CALCIUM 600 + D PO) Take by mouth daily.     Yes Historical Provider, MD  donepezil (ARICEPT) 10 MG tablet Take 10 mg by mouth at bedtime.   Yes Historical Provider, MD  fosinopril (MONOPRIL) 20 MG tablet Take 1 tablet (20 mg total) by mouth daily. 01/22/11  Yes Gordy Savers, MD  Multiple Vitamin (MULTIVITAMIN) tablet Take 1 tablet by mouth daily.     Yes Historical Provider, MD  solifenacin (VESICARE) 10 MG tablet Take 10 mg by mouth daily.   Yes Historical Provider, MD   Physical Exam: Filed Vitals:   10/03/13 1740 10/03/13 1848 10/03/13 2047 10/03/13 2128  BP: 122/93     Pulse: 84     Temp: 98.4 F (36.9 C)     TempSrc: Oral     Resp: 16     SpO2: 89% 96% 93% 92%     General:  Elderly female in NAD   Eyes: anicteric   ENT: trachea midline, moist mucosa, pink mucosa   Neck: trachea midline, no  JVD   Cardiovascular: sinus tachycardia, no MRG   Respiratory: exp wheezes present in all lung fields. Air movement present in all lung fields   Abdomen: soft, NT, ND   Skin: warm, dry   Musculoskeletal: no focal deficits   Psychiatric: AAOx4 , no distress   Neurologic: no focal deficits   Wt Readings from Last 3 Encounters:  07/23/11 76.204 kg (168 lb)  06/11/11 77.565 kg (171 lb)  05/28/11 75.66 kg (166 lb 12.8 oz)    Labs on Admission:  Basic Metabolic Panel:  Recent Labs Lab 10/03/13 1910  NA 138  K 3.8  CL 97  CO2 29  GLUCOSE 88  BUN 16  CREATININE 0.76  CALCIUM 8.5    Liver Function Tests:  Recent Labs Lab 10/03/13 1910  AST 27  ALT 23  ALKPHOS 80  BILITOT 0.2*  PROT 6.8  ALBUMIN 3.6   No results found for  this basename: LIPASE, AMYLASE,  in the last 168 hours No results found for this basename: AMMONIA,  in the last 168 hours  CBC:  Recent Labs Lab 10/03/13 1910  WBC 2.6*  NEUTROABS 1.4*  HGB 13.7  HCT 41.0  MCV 91.3  PLT 178    Cardiac Enzymes:  Recent Labs Lab 10/03/13 1910  CKTOTAL 191*  TROPONINI <0.30    Troponin (Point of Care Test) No results found for this basename: TROPIPOC,  in the last 72 hours  BNP (last 3 results)  Recent Labs  10/03/13 1920  PROBNP 55.4    CBG: No results found for this basename: GLUCAP,  in the last 168 hours   Radiological Exams on Admission: Dg Chest 2 View  10/03/2013   CLINICAL DATA:  Cough and wheezing.  EXAM: CHEST  2 VIEW  COMPARISON:  04/13/2013, 04/10/2013 and 12/25/2004 Heart size is normal. Main pulmonary arteries are chronically slightly prominent. Chronic elevation of the right hemidiaphragm. There is slight scarring at the left lung base laterally. There is slight peribronchial thickening in and atelectasis at the right lung base.  Ventriculoperitoneal shunt tube is noted crossing the right side of the chest. Thoracolumbar scoliosis, unchanged. No acute osseous abnormality.  FINDINGS: Bronchitic changes in slight atelectasis on the right.  IMPRESSION: No active cardiopulmonary disease.   Electronically Signed   By: Geanie Cooley M.D.   On: 10/03/2013 18:53   Ct Head Wo Contrast  10/03/2013   CLINICAL DATA:  Fall.  History of aneurysm.  EXAM: CT HEAD WITHOUT CONTRAST  TECHNIQUE: Contiguous axial images were obtained from the base of the skull through the vertex without intravenous contrast.  COMPARISON:  04/05/2010.  FINDINGS: No skull fracture or intracranial hemorrhage.  Remote right temporal craniectomy with placement of aneurysm clip right cavernous sinus region. Encephalomalacia right temporal lobe felt to be related to the operative approach.  Remote left parietal-occipital lobe infarct.  Small vessel disease type changes  without CT evidence of large acute infarct.  Ventricular shunt enters from the right frontal region with tip in the right frontal horn. No evidence of hydrocephalus.  No intracranial mass lesion noted on this unenhanced exam.  Mastoid air cells, middle ear cavities and visualized paranasal sinuses are clear.  IMPRESSION: Postoperative changes as noted above without evidence of acute fracture or intracranial hemorrhage.   Electronically Signed   By: Bridgett Larsson M.D.   On: 10/03/2013 18:47       Active Problems:   Fall   Ear abrasion   Hypoxia   Wheezing  Assessment/Plan 1. Acute hypoxic respiratory failure - likely 2/2 obstructive lung disease exacerbated by potential URI. Placed on [place], solumedrol 60mg  IV daily, duonebs. Admit to tele floor. Check flu panel to ensure no viral URI. CXR clear, consider CT chest if no improvement overnight. Given no signs of infection, not placing on abx tonight. Encourage po hydration  2. Hx of cerebral aneurysm - CT showed stability. Will monitor for focal changes  3. HTN - home meds  4. Dementia - home meds  5. PPx - lovenox   Code Status: DNR  Family Communication: daughter at bedside. Lives w/ husband  Disposition Plan/Anticipated LOS: 2-3 days   Time spent:  40 minutes  Susan PennaScott Bethany Hirt, MD  Internal Medicine Pager [phone] If 7PM-7AM, please contact night-coverage at [email or web address], password [email or web address] 10/03/2013, 11:26 PM

## 2013-10-04 ENCOUNTER — Encounter (HOSPITAL_COMMUNITY): Payer: Self-pay | Admitting: *Deleted

## 2013-10-04 DIAGNOSIS — J209 Acute bronchitis, unspecified: Secondary | ICD-10-CM

## 2013-10-04 LAB — BASIC METABOLIC PANEL
BUN: 16 mg/dL (ref 6–23)
CHLORIDE: 93 meq/L — AB (ref 96–112)
CO2: 21 mEq/L (ref 19–32)
Calcium: 8.5 mg/dL (ref 8.4–10.5)
Creatinine, Ser: 0.69 mg/dL (ref 0.50–1.10)
GFR calc non Af Amer: 78 mL/min — ABNORMAL LOW (ref >90)
Glucose, Bld: 235 mg/dL — ABNORMAL HIGH (ref 70–99)
POTASSIUM: 3.5 meq/L — AB (ref 3.7–5.3)
SODIUM: 134 meq/L — AB (ref 137–147)

## 2013-10-04 LAB — MRSA PCR SCREENING: MRSA by PCR: NEGATIVE

## 2013-10-04 LAB — CBC
HEMATOCRIT: 42.9 % (ref 36.0–46.0)
HEMOGLOBIN: 14.2 g/dL (ref 12.0–15.0)
MCH: 30.4 pg (ref 26.0–34.0)
MCHC: 33.1 g/dL (ref 30.0–36.0)
MCV: 91.9 fL (ref 78.0–100.0)
Platelets: 202 10*3/uL (ref 150–400)
RBC: 4.67 MIL/uL (ref 3.87–5.11)
RDW: 14.4 % (ref 11.5–15.5)
WBC: 2.9 10*3/uL — AB (ref 4.0–10.5)

## 2013-10-04 LAB — INFLUENZA PANEL BY PCR (TYPE A & B)
H1N1FLUPCR: NOT DETECTED
Influenza A By PCR: POSITIVE — AB
Influenza B By PCR: NEGATIVE

## 2013-10-04 MED ORDER — LISINOPRIL 20 MG PO TABS
20.0000 mg | ORAL_TABLET | Freq: Every day | ORAL | Status: DC
  Administered 2013-10-04 – 2013-10-06 (×3): 20 mg via ORAL
  Filled 2013-10-04 (×4): qty 1

## 2013-10-04 MED ORDER — ASPIRIN 325 MG PO TABS
325.0000 mg | ORAL_TABLET | Freq: Every day | ORAL | Status: DC
  Administered 2013-10-04 – 2013-10-09 (×6): 325 mg via ORAL
  Filled 2013-10-04 (×6): qty 1

## 2013-10-04 MED ORDER — ZOLPIDEM TARTRATE 5 MG PO TABS: 5.0000 mg | ORAL_TABLET | Freq: Every evening | ORAL | Status: DC | PRN

## 2013-10-04 MED ORDER — DONEPEZIL HCL 10 MG PO TABS
10.0000 mg | ORAL_TABLET | Freq: Every day | ORAL | Status: DC
  Administered 2013-10-04 – 2013-10-08 (×6): 10 mg via ORAL
  Filled 2013-10-04 (×7): qty 1

## 2013-10-04 MED ORDER — ALUM & MAG HYDROXIDE-SIMETH 200-200-20 MG/5ML PO SUSP: 30.0000 mL | Freq: Four times a day (QID) | ORAL | Status: DC | PRN

## 2013-10-04 MED ORDER — SODIUM CHLORIDE 0.9 % IJ SOLN
3.0000 mL | Freq: Two times a day (BID) | INTRAMUSCULAR | Status: DC
  Administered 2013-10-04 – 2013-10-09 (×3): 3 mL via INTRAVENOUS

## 2013-10-04 MED ORDER — SODIUM CHLORIDE 0.9 % IJ SOLN: 3.0000 mL | INTRAMUSCULAR | Status: DC | PRN

## 2013-10-04 MED ORDER — ONDANSETRON HCL 4 MG/2ML IJ SOLN: 4.0000 mg | Freq: Four times a day (QID) | INTRAMUSCULAR | Status: DC | PRN

## 2013-10-04 MED ORDER — SODIUM CHLORIDE 0.9 % IV SOLN
250.0000 mL | INTRAVENOUS | Status: DC | PRN
Start: 2013-10-04 — End: 2013-10-09

## 2013-10-04 MED ORDER — DARIFENACIN HYDROBROMIDE ER 15 MG PO TB24
15.0000 mg | ORAL_TABLET | Freq: Every day | ORAL | Status: DC
Start: 2013-10-04 — End: 2013-10-09
  Administered 2013-10-04 – 2013-10-09 (×6): 15 mg via ORAL
  Filled 2013-10-04 (×6): qty 1

## 2013-10-04 MED ORDER — IPRATROPIUM-ALBUTEROL 0.5-2.5 (3) MG/3ML IN SOLN
3.0000 mL | RESPIRATORY_TRACT | Status: DC
  Administered 2013-10-04 – 2013-10-05 (×7): 3 mL via RESPIRATORY_TRACT
  Filled 2013-10-04 (×7): qty 3

## 2013-10-04 MED ORDER — PREDNISONE 20 MG PO TABS
40.0000 mg | ORAL_TABLET | Freq: Every day | ORAL | Status: DC
  Administered 2013-10-05 – 2013-10-08 (×4): 40 mg via ORAL
  Filled 2013-10-04 (×5): qty 2

## 2013-10-04 MED ORDER — SODIUM CHLORIDE 0.9 % IJ SOLN
3.0000 mL | Freq: Two times a day (BID) | INTRAMUSCULAR | Status: DC
  Administered 2013-10-04 – 2013-10-08 (×8): 3 mL via INTRAVENOUS

## 2013-10-04 MED ORDER — METHYLPREDNISOLONE SODIUM SUCC 125 MG IJ SOLR
60.0000 mg | Freq: Every day | INTRAMUSCULAR | Status: DC
  Administered 2013-10-04: 60 mg via INTRAVENOUS
  Filled 2013-10-04: qty 0.96

## 2013-10-04 MED ORDER — ONDANSETRON HCL 4 MG PO TABS: 4.0000 mg | ORAL_TABLET | Freq: Four times a day (QID) | ORAL | Status: DC | PRN

## 2013-10-04 MED ORDER — POLYETHYLENE GLYCOL 3350 17 G PO PACK
17.0000 g | PACK | Freq: Every day | ORAL | Status: DC | PRN
  Filled 2013-10-04: qty 1

## 2013-10-04 MED ORDER — OSELTAMIVIR PHOSPHATE 75 MG PO CAPS
75.0000 mg | ORAL_CAPSULE | Freq: Every day | ORAL | Status: AC
  Administered 2013-10-04 – 2013-10-08 (×5): 75 mg via ORAL
  Filled 2013-10-04 (×5): qty 1

## 2013-10-04 MED ORDER — ENOXAPARIN SODIUM 40 MG/0.4ML ~~LOC~~ SOLN
40.0000 mg | SUBCUTANEOUS | Status: DC
  Administered 2013-10-04 – 2013-10-08 (×5): 40 mg via SUBCUTANEOUS
  Filled 2013-10-04 (×6): qty 0.4

## 2013-10-04 NOTE — Progress Notes (Addendum)
[facility] PROGRESS NOTE  Susan ChimesJanet F Kentner ZOX:[id number] DOB: 1928-09-05 DOA: 10/03/2013 PCP: Ginette OttoSTONEKING,HAL THOMAS, MD  Assessment/Plan: 1. Influenza A. -Place on Tamiflu, continue supportive care and follow 2.Acute hypoxic rest of failure/Bronchitis with Bronchospasm -Continue nebs, change Solu-Medrol to prednisone beginning in a.m. -Continue supplemental oxygen 3. Hypertension -Continue outpatient medications 4. Dementia -Continue outpatient medications -PTOT consult 5. History of cerebral aneurysym  Code Status: DO NOT RESUSCITATE Family Communication: None at bedside Disposition Plan: Pending clinical course   Consultants:  none  Procedures:  none  Antibiotics:  none  HPI/Subjective: Patient denies shortness of breath, denies chest pain. Some confusion  Objective: Filed Vitals:   10/04/13 1451  BP: 115/92  Pulse: 80  Temp: 98.4 F (36.9 C)  Resp: 21    Intake/Output Summary (Last 24 hours) at 10/04/13 1858 Last data filed at 10/04/13 1845  Gross per 24 hour  Intake    600 ml  Output   1000 ml  Net   -400 ml   Filed Weights   10/04/13 0032  Weight: 78.5 kg (173 lb 1 oz)    Exam:  General: alert & oriented x 2 In NAD, some confusion Cardiovascular: RRR, nl S1 s2 Respiratory: Occasional rhonchi, no wheezes Abdomen: soft +BS NT/ND, no masses palpable Extremities: No cyanosis and no edema    Data Reviewed: Basic Metabolic Panel:  Recent Labs Lab 10/03/13 1910 10/04/13 0540  NA 138 134*  K 3.8 3.5*  CL 97 93*  CO2 29 21  GLUCOSE 88 235*  BUN 16 16  CREATININE 0.76 0.69  CALCIUM 8.5 8.5   Liver Function Tests:  Recent Labs Lab 10/03/13 1910  AST 27  ALT 23  ALKPHOS 80  BILITOT 0.2*  PROT 6.8  ALBUMIN 3.6   No results found for this basename: LIPASE, AMYLASE,  in the last 168 hours No results found for this basename: AMMONIA,  in the last 168 hours CBC:  Recent Labs Lab 10/03/13 1910 10/04/13 0540  WBC 2.6*  2.9*  NEUTROABS 1.4*  --   HGB 13.7 14.2  HCT 41.0 42.9  MCV 91.3 91.9  PLT 178 202   Cardiac Enzymes:  Recent Labs Lab 10/03/13 1910  CKTOTAL 191*  TROPONINI <0.30   BNP (last 3 results)  Recent Labs  10/03/13 1920  PROBNP 55.4   CBG: No results found for this basename: GLUCAP,  in the last 168 hours  Recent Results (from the past 240 hour(s))  MRSA PCR SCREENING     Status: None   Collection Time    10/04/13  1:15 AM      Result Value Range Status   MRSA by PCR NEGATIVE  NEGATIVE Final   Comment:            The GeneXpert MRSA Assay (FDA     approved for NASAL specimens     only), is one component of a     comprehensive MRSA colonization     surveillance program. It is not     intended to diagnose MRSA     infection nor to guide or     monitor treatment for     MRSA infections.     Studies: Dg Chest 2 View  10/03/2013   CLINICAL DATA:  Cough and wheezing.  EXAM: CHEST  2 VIEW  COMPARISON:  04/13/2013, 04/10/2013 and 12/25/2004 Heart size is normal. Main pulmonary arteries are chronically slightly prominent. Chronic elevation of the right hemidiaphragm. There is slight scarring at the left  lung base laterally. There is slight peribronchial thickening in and atelectasis at the right lung base.  Ventriculoperitoneal shunt tube is noted crossing the right side of the chest. Thoracolumbar scoliosis, unchanged. No acute osseous abnormality.  FINDINGS: Bronchitic changes in slight atelectasis on the right.  IMPRESSION: No active cardiopulmonary disease.   Electronically Signed   By: Geanie Cooley M.D.   On: 10/03/2013 18:53   Ct Head Wo Contrast  10/03/2013   CLINICAL DATA:  Fall.  History of aneurysm.  EXAM: CT HEAD WITHOUT CONTRAST  TECHNIQUE: Contiguous axial images were obtained from the base of the skull through the vertex without intravenous contrast.  COMPARISON:  04/05/2010.  FINDINGS: No skull fracture or intracranial hemorrhage.  Remote right temporal craniectomy  with placement of aneurysm clip right cavernous sinus region. Encephalomalacia right temporal lobe felt to be related to the operative approach.  Remote left parietal-occipital lobe infarct.  Small vessel disease type changes without CT evidence of large acute infarct.  Ventricular shunt enters from the right frontal region with tip in the right frontal horn. No evidence of hydrocephalus.  No intracranial mass lesion noted on this unenhanced exam.  Mastoid air cells, middle ear cavities and visualized paranasal sinuses are clear.  IMPRESSION: Postoperative changes as noted above without evidence of acute fracture or intracranial hemorrhage.   Electronically Signed   By: Bridgett Larsson M.D.   On: 10/03/2013 18:47    Scheduled Meds: . aspirin  325 mg Oral Daily  . darifenacin  15 mg Oral Daily  . donepezil  10 mg Oral QHS  . enoxaparin (LOVENOX) injection  40 mg Subcutaneous Q24H  . ipratropium-albuterol  3 mL Nebulization Q4H  . lisinopril  20 mg Oral Daily  . methylPREDNISolone (SOLU-MEDROL) injection  60 mg Intravenous Daily  . sodium chloride  3 mL Intravenous Q12H  . sodium chloride  3 mL Intravenous Q12H   Continuous Infusions:   Active Problems:   Fall   Ear abrasion   Hypoxia   Wheezing    Time spent: 18    [facility] C  [facility] Pager [phone]. If 7PM-7AM, please contact night-coverage at [email or web address], password [facility] 10/04/2013, 6:58 PM  LOS: 1 day

## 2013-10-04 NOTE — Care Management Note (Addendum)
    Page 1 of 2   10/08/2013     1:28:21 PM   CARE MANAGEMENT NOTE 10/08/2013  Patient:  Susan LatusMIDGETT,Saree F   Account Number:  [id number]  Date Initiated:  10/04/2013  Documentation initiated by:  [facility] CenterMAHABIR,Glenis Musolf  Subjective/Objective Assessment:   80 Y/O F ADMITTED W/SOB,FALL.YN:WGNFHX:LUNG DZ,CSF SHUNT.     Action/Plan:   FROM [facility].   Anticipated DC Date:  10/09/2013   Anticipated DC Plan:  HOME W HOME HEALTH SERVICES      DC Planning Services  CM consult      Choice offered to / List presented to:          [facility]  [facility] RN  HH-2 PT  HH-3 OT  HH-4 NURSE'S AIDE      HH agency  [facility]   Status of service:  In process, will continue to follow Medicare Important Message given?   (If response is "NO", the following Medicare IM given date fields will be blank) Date Medicare IM given:   Date Additional Medicare IM given:    Discharge Disposition:    Per UR Regulation:  Reviewed for med. necessity/level of care/duration of stay  If discussed at Long Length of Stay Meetings, dates discussed:    Comments:  10/08/13 Jendayi Berling RN,BSN NCM [phone] NO QUALIFYING DX FOR HOME 02 PER MD, [facility] DME REP AWARE.  10/07/13 Minyon Billiter RN,BSN NCM [phone] TC [facility] TEL#[phone],SPOKE TO [facility],THEY HAVE THEIR OWN HHC. FAXED W/CONFIRMATION HHC ORDERS TO FAX#[phone] ATTN SCOTT.  10/04/13 Yared Susan RN,BSN NCM [phone] WOULD RECOMMEND PT CONS WHEN APPROPRIATE.

## 2013-10-04 NOTE — Progress Notes (Signed)
PHARMACIST - PHYSICIAN COMMUNICATION DR: Donna BernardViyouh CONCERNING:  Oseltamivir 30 mg capsule shortage  DESCRIPTION:  [facility] is experiencing a significant shortage of Oseltamivir 30mg  capsules.    This patient has an order for Oseltamivir (Tamiflu) and has an Estimated Creatinine Clearance: 49.4 ml/min (by C-G formula based on Cr of 0.69).. The package insert for Oseltamivir recommends 30mg  BID x 5 days for patients with CrCl 30-60 ml/min.  To preserve an adequate supply of 30mg  capsules for our patients with more significant renal impairment the Infectious Disease team has recommended to substitute Oseltamivir 75mg  qday x 5 days for patients with CrCl 30-60 ml/min at this time.   RECOMMENDATION: Oseltamivir 75mg  PO DAILY x 5 days has been substituted for your patient. If you have any questions about this temporary substitution please feel free to call the Pharmacy at [phone] assistance.  Thank you,  Otho BellowsGreen, Vanity Larsson L PharmD Pager [phone] 10/04/2013, 7:36 PM

## 2013-10-05 LAB — CBC
HCT: 41.2 % (ref 36.0–46.0)
Hemoglobin: 13.8 g/dL (ref 12.0–15.0)
MCH: 30.5 pg (ref 26.0–34.0)
MCHC: 33.5 g/dL (ref 30.0–36.0)
MCV: 91.2 fL (ref 78.0–100.0)
Platelets: 200 10*3/uL (ref 150–400)
RBC: 4.52 MIL/uL (ref 3.87–5.11)
RDW: 14.2 % (ref 11.5–15.5)
WBC: 6.1 10*3/uL (ref 4.0–10.5)

## 2013-10-05 LAB — BASIC METABOLIC PANEL
BUN: 19 mg/dL (ref 6–23)
CO2: 28 meq/L (ref 19–32)
Calcium: 8.6 mg/dL (ref 8.4–10.5)
Chloride: 100 mEq/L (ref 96–112)
Creatinine, Ser: 0.76 mg/dL (ref 0.50–1.10)
GFR calc Af Amer: 88 mL/min — ABNORMAL LOW (ref >90)
GFR calc non Af Amer: 76 mL/min — ABNORMAL LOW (ref >90)
Glucose, Bld: 116 mg/dL — ABNORMAL HIGH (ref 70–99)
Potassium: 3.4 mEq/L — ABNORMAL LOW (ref 3.7–5.3)
Sodium: 140 mEq/L (ref 137–147)

## 2013-10-05 MED ORDER — ALBUTEROL SULFATE (2.5 MG/3ML) 0.083% IN NEBU
2.5000 mg | INHALATION_SOLUTION | Freq: Four times a day (QID) | RESPIRATORY_TRACT | Status: DC | PRN
  Administered 2013-10-05: 2.5 mg via RESPIRATORY_TRACT
  Filled 2013-10-05: qty 3

## 2013-10-05 MED ORDER — POTASSIUM CHLORIDE CRYS ER 20 MEQ PO TBCR
40.0000 meq | EXTENDED_RELEASE_TABLET | Freq: Once | ORAL | Status: AC
  Administered 2013-10-05: 40 meq via ORAL
  Filled 2013-10-05: qty 2

## 2013-10-05 MED ORDER — DM-GUAIFENESIN ER 30-600 MG PO TB12
1.0000 | ORAL_TABLET | Freq: Two times a day (BID) | ORAL | Status: DC
  Administered 2013-10-05 – 2013-10-09 (×8): 1 via ORAL
  Filled 2013-10-05 (×9): qty 1

## 2013-10-05 MED ORDER — IPRATROPIUM-ALBUTEROL 0.5-2.5 (3) MG/3ML IN SOLN
3.0000 mL | RESPIRATORY_TRACT | Status: DC
  Administered 2013-10-05 (×4): 3 mL via RESPIRATORY_TRACT
  Filled 2013-10-05 (×4): qty 3

## 2013-10-05 MED ORDER — IPRATROPIUM-ALBUTEROL 0.5-2.5 (3) MG/3ML IN SOLN
3.0000 mL | Freq: Four times a day (QID) | RESPIRATORY_TRACT | Status: DC
  Administered 2013-10-06 – 2013-10-08 (×12): 3 mL via RESPIRATORY_TRACT
  Filled 2013-10-05 (×12): qty 3

## 2013-10-05 NOTE — Evaluation (Signed)
Physical Therapy Evaluation Patient Details Name: Susan LatusJanet F Kronenberger MRN: [id number] DOB: 1928/09/06 Today's Date: 10/05/2013 Time: [id number] PT Time Calculation (min): 24 min  SATURATION QUALIFICATIONS: (This note is used to comply with regulatory documentation for home oxygen)  Patient Saturations on Room Air at Rest = 94%  Patient Saturations on Room Air while Ambulating = 87-88%    PT Assessment / Plan / Recommendation History of Present Illness  80 yo female admitted with fall, hypoxia, flu+. Hx of cerebral aneurysm, HTN, mild cognitive impairment, dementia, obstructive lung disease. Pt lives in Ind Living with husband  Clinical Impression  On eval, pt required Min assist for mobility-able to ambulate ~150 feet with walker. LOB x 1 posteriorly with initial standing.     PT Assessment  Patient needs continued PT services    Follow Up Recommendations  Home health PT;Supervision/Assistance - 24 hour    Does the patient have the potential to tolerate intense rehabilitation      Barriers to Discharge        Equipment Recommendations  None recommended by PT    Recommendations for Other Services OT consult   Frequency Min 3X/week    Precautions / Restrictions Precautions Precautions: Fall Restrictions Weight Bearing Restrictions: No   Pertinent Vitals/Pain Pt denied pain Replaced [place] O2 at end of session      Mobility  Bed Mobility Overal bed mobility: Modified Independent Transfers Overall transfer level: Needs assistance Equipment used: Rolling walker (2 wheeled) Transfers: Sit to/from Stand Sit to Stand: Min assist General transfer comment: Mutliple attempts by patient to rise without assistance. LOB x 1 posteriorly with initial standing.  Ambulation/Gait Ambulation/Gait assistance: Min guard Ambulation Distance (Feet): 150 Feet Assistive device: Rolling walker (2 wheeled) Gait Pattern/deviations: Decreased stride length;Trunk flexed;Step-through  pattern General Gait Details: slow gait speed. dyspnea 2-3/4. O2 sats 87-88% on RA. Fatigues fairly easily. close guard for safety. Noted abnormal gait pattern.     Exercises     PT Diagnosis: Difficulty walking;Generalized weakness  PT Problem List: Decreased strength;Decreased activity tolerance;Decreased balance;Decreased mobility;Decreased cognition PT Treatment Interventions: DME instruction;Gait training;Functional mobility training;Therapeutic activities;Therapeutic exercise;Patient/family education;Balance training     PT Goals(Current goals can be found in the care plan section) Acute Rehab PT Goals Patient Stated Goal: home soon PT Goal Formulation: With patient Time For Goal Achievement: 10/19/13 Potential to Achieve Goals: Good  Visit Information  Last PT Received On: 10/05/13 Assistance Needed: +1 History of Present Illness: 80 yo female admitted with fall, hypoxia, flu+. Hx of cerebral aneurysm, HTN, mild cognitive impairment, dementia, obstructive lung disease. Pt lives in Ind Living with husband       Prior Functioning  Home Living Family/patient expects to be discharged to:: Private residence Living Arrangements: Spouse/significant other Type of Home: Independent living facility Home Access: Level entry Home Layout: One level Home Equipment: Environmental consultantWalker - 4 wheels;Cane - single point Prior Function Level of Independence: Independent with assistive device(s) Communication Communication: No difficulties    Cognition  Cognition Arousal/Alertness: Awake/alert Behavior During Therapy: WFL for tasks assessed/performed Overall Cognitive Status: Within Functional Limits for tasks assessed    Extremity/Trunk Assessment Upper Extremity Assessment Upper Extremity Assessment: Defer to OT evaluation Lower Extremity Assessment Lower Extremity Assessment: Generalized weakness Cervical / Trunk Assessment Cervical / Trunk Assessment: Kyphotic   Balance Balance Overall  balance assessment: History of Falls;Needs assistance Sitting-balance support: Bilateral upper extremity supported;Feet supported Sitting balance-Leahy Scale: Normal Standing balance support: Bilateral upper extremity supported;During functional activity Standing balance-Leahy Scale: Poor  End of Session PT - End of Session Equipment Utilized During Treatment: Gait belt Activity Tolerance: Patient tolerated treatment well Patient left: in chair;with call bell/phone within reach  GP     Rebeca Alert, MPT Pager: [phone]

## 2013-10-05 NOTE — Progress Notes (Signed)
SATURATION QUALIFICATIONS: (This note is used to comply with regulatory documentation for home oxygen)   Patient Saturations on Room Air at Rest = 94%  Patient Saturations on Room Air while Ambulating = 87-88%

## 2013-10-05 NOTE — Progress Notes (Signed)
[facility] PROGRESS NOTE  Susan Landry ZOX:[id number] DOB: 1928/01/02 DOA: 10/03/2013 PCP: Ginette Otto, MD  Assessment/Plan: 1. Influenza A. -contnue Tamiflu, supportive care and follow 2.Acute hypoxic rest of failure/Bronchitis with Bronchospasm -Improving clinically, Continue nebs, prednisone. Will add Mucinex DM and follow -Noted that she was worked up in the past by Dr. Clance/pulmonology but findings were NOT c/w copd -Continue supplemental oxygen 3. Hypertension -Continue outpatient medications 4. Dementia -Continue outpatient medications -PTOT consult 5. History of cerebral aneurysym 6. Hypokalemia -Replace potassium  Code Status: DO NOT RESUSCITATE Family Communication: Daughter Pearletha Furl by phone at [phone] Disposition Plan: to [facility] medically ready   Consultants:  none  Procedures:  none  Antibiotics:  none  HPI/Subjective: She is more alert and oriented today, breathing better.+cough but decreased  Objective: Filed Vitals:   10/05/13 1358  BP: 117/79  Pulse: 82  Temp: 98.5 F (36.9 C)  Resp: 19    Intake/Output Summary (Last 24 hours) at 10/05/13 1523 Last data filed at 10/05/13 1359  Gross per 24 hour  Intake    720 ml  Output    550 ml  Net    170 ml   Filed Weights   10/04/13 0032  Weight: 78.5 kg (173 lb 1 oz)    Exam:  General: alert & appropriate. In no apparent distress Cardiovascular: RRR, nl S1 s2 Respiratory: Occasional wheeze and some rhonchi. Abdomen: soft +BS NT/ND, no masses palpable Extremities: No cyanosis and no edema    Data Reviewed: Basic Metabolic Panel:  Recent Labs Lab 10/03/13 1910 10/04/13 0540 10/05/13 0500  NA 138 134* 140  K 3.8 3.5* 3.4*  CL 97 93* 100  CO2 29 21 28   GLUCOSE 88 235* 116*  BUN 16 16 19   CREATININE 0.76 0.69 0.76  CALCIUM 8.5 8.5 8.6   Liver Function Tests:  Recent Labs Lab 10/03/13 1910  AST 27  ALT 23  ALKPHOS 80  BILITOT 0.2*  PROT 6.8   ALBUMIN 3.6   No results found for this basename: LIPASE, AMYLASE,  in the last 168 hours No results found for this basename: AMMONIA,  in the last 168 hours CBC:  Recent Labs Lab 10/03/13 1910 10/04/13 0540 10/05/13 0500  WBC 2.6* 2.9* 6.1  NEUTROABS 1.4*  --   --   HGB 13.7 14.2 13.8  HCT 41.0 42.9 41.2  MCV 91.3 91.9 91.2  PLT 178 202 200   Cardiac Enzymes:  Recent Labs Lab 10/03/13 1910  CKTOTAL 191*  TROPONINI <0.30   BNP (last 3 results)  Recent Labs  10/03/13 1920  PROBNP 55.4   CBG: No results found for this basename: GLUCAP,  in the last 168 hours  Recent Results (from the past 240 hour(s))  MRSA PCR SCREENING     Status: None   Collection Time    10/04/13  1:15 AM      Result Value Range Status   MRSA by PCR NEGATIVE  NEGATIVE Final   Comment:            The GeneXpert MRSA Assay (FDA     approved for NASAL specimens     only), is one component of a     comprehensive MRSA colonization     surveillance program. It is not     intended to diagnose MRSA     infection nor to guide or     monitor treatment for     MRSA infections.     Studies: Dg  Chest 2 View  10/03/2013   CLINICAL DATA:  Cough and wheezing.  EXAM: CHEST  2 VIEW  COMPARISON:  04/13/2013, 04/10/2013 and 12/25/2004 Heart size is normal. Main pulmonary arteries are chronically slightly prominent. Chronic elevation of the right hemidiaphragm. There is slight scarring at the left lung base laterally. There is slight peribronchial thickening in and atelectasis at the right lung base.  Ventriculoperitoneal shunt tube is noted crossing the right side of the chest. Thoracolumbar scoliosis, unchanged. No acute osseous abnormality.  FINDINGS: Bronchitic changes in slight atelectasis on the right.  IMPRESSION: No active cardiopulmonary disease.   Electronically Signed   By: Geanie CooleyJim  Maxwell M.D.   On: 10/03/2013 18:53   Ct Head Wo Contrast  10/03/2013   CLINICAL DATA:  Fall.  History of aneurysm.   EXAM: CT HEAD WITHOUT CONTRAST  TECHNIQUE: Contiguous axial images were obtained from the base of the skull through the vertex without intravenous contrast.  COMPARISON:  04/05/2010.  FINDINGS: No skull fracture or intracranial hemorrhage.  Remote right temporal craniectomy with placement of aneurysm clip right cavernous sinus region. Encephalomalacia right temporal lobe felt to be related to the operative approach.  Remote left parietal-occipital lobe infarct.  Small vessel disease type changes without CT evidence of large acute infarct.  Ventricular shunt enters from the right frontal region with tip in the right frontal horn. No evidence of hydrocephalus.  No intracranial mass lesion noted on this unenhanced exam.  Mastoid air cells, middle ear cavities and visualized paranasal sinuses are clear.  IMPRESSION: Postoperative changes as noted above without evidence of acute fracture or intracranial hemorrhage.   Electronically Signed   By: Bridgett LarssonSteve  Olson M.D.   On: 10/03/2013 18:47    Scheduled Meds: . aspirin  325 mg Oral Daily  . darifenacin  15 mg Oral Daily  . donepezil  10 mg Oral QHS  . enoxaparin (LOVENOX) injection  40 mg Subcutaneous Q24H  . ipratropium-albuterol  3 mL Nebulization Q4H WA  . lisinopril  20 mg Oral Daily  . oseltamivir  75 mg Oral Daily  . predniSONE  40 mg Oral Q breakfast  . sodium chloride  3 mL Intravenous Q12H  . sodium chloride  3 mL Intravenous Q12H   Continuous Infusions:   Active Problems:   Fall   Ear abrasion   Hypoxia   Wheezing    Time spent: 3435    [facility] Hospital IncVIYUOH,Carmine Carrozza C  [facility] Pager [phone]. If 7PM-7AM, please contact night-coverage at [email or web address], password [facility] 10/05/2013, 3:23 PM  LOS: 2 days

## 2013-10-05 NOTE — Evaluation (Signed)
Occupational Therapy Evaluation Patient Details Name: Susan Landry MRN: [id number] DOB: Nov 18, 1927 Today's Date: 10/05/2013 Time: 9562-1308 OT Time Calculation (min): 33 min  OT Assessment / Plan / Recommendation History of present illness 80 yo female admitted with fall, hypoxia, flu+. Hx of cerebral aneurysm, HTN, mild cognitive impairment, dementia, obstructive lung disease. Pt lives in Ind Living with husband   Clinical Impression   Pt was admitted with the above.  She will benefit from skilled OT to increase safety and independence with adls.  Pt reports being mod I with basic ADLs prior to admission.  She currently needs min A overall and supervision level goals are set.      OT Assessment  Patient needs continued OT Services    Follow Up Recommendations  SNF (vs HHOT with 24/7, min A for balance)    Barriers to Discharge      Equipment Recommendations  3 in 1 bedside comode    Recommendations for Other Services    Frequency  Min 2X/week    Precautions / Restrictions Precautions Precautions: Fall Restrictions Weight Bearing Restrictions: No   Pertinent Vitals/Pain No c/o pain    ADL  Grooming: Set up Where Assessed - Grooming: Supported sitting Upper Body Bathing: Set up Where Assessed - Upper Body Bathing: Unsupported sitting Lower Body Bathing: Minimal assistance Where Assessed - Lower Body Bathing: Supported sit to stand Upper Body Dressing: Set up Where Assessed - Upper Body Dressing: Unsupported sitting Lower Body Dressing: Minimal assistance Where Assessed - Lower Body Dressing: Supported sit to stand Toilet Transfer: Minimal assistance Toilet Transfer Method: Surveyor, minerals: Materials engineer and Hygiene: Minimal assistance Where Assessed - Engineer, mining and Hygiene: Sit to stand from 3-in-1 or toilet Transfers/Ambulation Related to ADLs: pt lost balance once when performing  toilet hygiene ADL Comments: incontinent x urine.  Pt uses pads.  Dyspnea 2/4 with ADL:  cued to take rest breaks.  Pt completed full adl from both recliner and 3;1 commode.  Educated to take rest breaks for energy conservation    OT Diagnosis: Generalized weakness  OT Problem List: Decreased strength;Decreased activity tolerance;Impaired balance (sitting and/or standing);Decreased cognition;Decreased knowledge of use of DME or AE;Cardiopulmonary status limiting activity OT Treatment Interventions: Self-care/ADL training;DME and/or AE instruction;Patient/family education;Balance training;Energy conservation   OT Goals(Current goals can be found in the care plan section) Acute Rehab OT Goals Patient Stated Goal: home soon OT Goal Formulation: With patient Time For Goal Achievement: 10/19/13 Potential to Achieve Goals: Good ADL Goals Pt Will Transfer to Toilet: with supervision;bedside commode;ambulating Pt Will Perform Toileting - Clothing Manipulation and hygiene: with supervision;sit to/from stand Additional ADL Goal #1: pt will perform LB adls with supervision, sit to stand Additional ADL Goal #2: Pt will initiate at least one break for energy conservation  Visit Information  Last OT Received On: 10/05/13 Assistance Needed: +1 History of Present Illness: 80 yo female admitted with fall, hypoxia, flu+. Hx of cerebral aneurysm, HTN, mild cognitive impairment, dementia, obstructive lung disease. Pt lives in Ind Living with husband       Prior Functioning     Home Living Family/patient expects to be discharged to:: Private residence Living Arrangements: Spouse/significant other Type of Home: Independent living facility Home Access: Level entry Home Layout: One level Home Equipment: Shower seat - built in Prior Function Level of Independence: Independent with assistive device(s) Communication Communication: No difficulties         Vision/Perception  Cognition   Cognition Arousal/Alertness: Awake/alert Behavior During Therapy: WFL for tasks assessed/performed Overall Cognitive Status: Within Functional Limits for tasks assessed h/o mild cognitive impairment   Extremity/Trunk Assessment Upper Extremity Assessment Upper Extremity Assessment: Generalized weakness Lower Extremity Assessment Lower Extremity Assessment: Generalized weakness Cervical / Trunk Assessment Cervical / Trunk Assessment: Kyphotic     Mobility  Transfers Overall transfer level: Needs assistance Equipment used: Rolling walker (2 wheeled) Transfers: Sit to/from Stand Sit to Stand: Min assist General transfer comment: Pt needs extra time and cues to scoot forward prior to standing.       Exercise     Balance Balance Overall balance assessment: History of Falls Sitting-balance support: No upper extremity supported Sitting balance-Leahy Scale: Normal Standing balance support: Single extremity supported Standing balance-Leahy Scale: Poor  One LOB during adls in standing General Comments General comments (skin integrity, edema, etc.): one LOB with adl.  Pt states that this fall occurred when she was sitting on bed and mattress sunk down.  She did hit head.  Pt wears emergency alert   End of Session OT - End of Session Activity Tolerance: Patient tolerated treatment well Patient left: in chair;with call bell/phone within reach;with chair alarm set  GO     Oluwadamilare Tobler 10/05/2013, 11:46 AM Marica OtterMaryellen Takashi Korol, OTR/L [phone] 10/05/2013

## 2013-10-06 DIAGNOSIS — J9600 Acute respiratory failure, unspecified whether with hypoxia or hypercapnia: Secondary | ICD-10-CM

## 2013-10-06 DIAGNOSIS — I5031 Acute diastolic (congestive) heart failure: Principal | ICD-10-CM

## 2013-10-06 DIAGNOSIS — J111 Influenza due to unidentified influenza virus with other respiratory manifestations: Secondary | ICD-10-CM

## 2013-10-06 MED ORDER — FUROSEMIDE 10 MG/ML IJ SOLN
20.0000 mg | Freq: Once | INTRAMUSCULAR | Status: AC
  Administered 2013-10-06: 20 mg via INTRAVENOUS
  Filled 2013-10-06: qty 2

## 2013-10-06 NOTE — Progress Notes (Signed)
Physical Therapy Treatment Patient Details Name: Susan LatusJanet F Landry MRN: [id number] DOB: 02-12-1928 Today's Date: 10/06/2013 Time: 1450-1530 PT Time Calculation (min): 40 min  PT Assessment / Plan / Recommendation  History of Present Illness 80 yo female admitted with fall, hypoxia, flu+. Hx of cerebral aneurysm, HTN, mild cognitive impairment, dementia, obstructive lung disease. Pt lives in Ind Living with husband   PT Comments   Pt OOB in recliner on 2 lts O2. Assisted to BR to void. Found pt incont so assisted with hygiene.  Amb in hallway with daughter following with chair.  Pt tolerated a good distance.   Follow Up Recommendations  Home health PT;Supervision/Assistance - 24 hour     Does the patient have the potential to tolerate intense rehabilitation     Barriers to Discharge        Equipment Recommendations       Recommendations for Other Services    Frequency Min 3X/week   Progress towards PT Goals Progress towards PT goals: Progressing toward goals  Plan      Precautions / Restrictions Precautions Precautions: Fall Restrictions Weight Bearing Restrictions: No    Pertinent Vitals/Pain No c/o pain    Mobility  Bed Mobility General bed mobility comments: Pt OOB in recliner Transfers Overall transfer level: Needs assistance Equipment used: Rolling walker (2 wheeled) Sit to Stand: Min assist General transfer comment: Pt needs extra time and cues to scoot forward prior to standing.   Ambulation/Gait Ambulation/Gait assistance: Min guard Ambulation Distance (Feet): 320 Feet Assistive device: Rolling walker (2 wheeled) Gait Pattern/deviations: Decreased stride length;Trunk flexed General Gait Details: X 2 standing rest breaks.  75% VC's on proper walker to self distance as pt tends to push walker to far forward.       PT Goals (current goals can now be found in the care plan section)    Visit Information  Last PT Received On: 10/06/13 Assistance Needed:  +1 History of Present Illness: 80 yo female admitted with fall, hypoxia, flu+. Hx of cerebral aneurysm, HTN, mild cognitive impairment, dementia, obstructive lung disease. Pt lives in Ind Living with husband    Subjective Data      Cognition       Balance     End of Session PT - End of Session Equipment Utilized During Treatment: Gait belt;Oxygen (2 lts) Activity Tolerance: Patient tolerated treatment well Patient left: in chair;with call bell/phone within reach   Felecia ShellingLori Sorayah Schrodt  PTA [facility]  Acute  Rehab Pager      [phone]

## 2013-10-06 NOTE — Progress Notes (Addendum)
[facility] PROGRESS NOTE  Susan LatusJanet F Landry WGN:[id number] DOB: 1928/01/30 DOA: 10/03/2013 PCP: Ginette OttoSTONEKING,HAL THOMAS, MD  Assessment/Plan  Acute hypoxic respiratory failure due to influenza A.  -contnue Tamiflu, supportive care and follow  -  Wean oxygen as tolerated -  Telemetry:  NSR, okay to D/C  Bronchitis with Bronchospasm, increased WOB and wheezing today - Continue nebs, prednisone.  -  Continue Mucinex DM -  Try lasix 20mg  IV once to treat any mild acute diastolic heart failure which may be contributing -  OOB, PT/OT as tolerated  Hypertension, BP mildly elevated.  Continue lisinopril  Dementia, stable.  Continue aricept  History of cerebral aneurysm, stable.  Hypokalemia, replaced and repeat BMP in AM  Diet:  regular Access:  PIV IVF:  off Proph:  lovenox  Code Status: DNR Family Communication: patient alone Disposition Plan: pending improvement in breathing, likely to SNF   Consultants:  none Procedures:  none Antibiotics:  none   HPI/Subjective:  Increased cough, wheeze, and SOB today.    Objective: Filed Vitals:   10/06/13 0606 10/06/13 0745 10/06/13 1146 10/06/13 1409  BP: 151/85   144/80  Pulse: 75   80  Temp: 98.5 F (36.9 C)   98.2 F (36.8 C)  TempSrc: Oral   Oral  Resp: 20   20  Height:      Weight:      SpO2: 92% 88% 91% 96%    Intake/Output Summary (Last 24 hours) at 10/06/13 1437 Last data filed at 10/06/13 1355  Gross per 24 hour  Intake    840 ml  Output    600 ml  Net    240 ml   Filed Weights   10/04/13 0032  Weight: 78.5 kg (173 lb 1 oz)    Exam:   General:  CF, audible wheeze when I enter the room and some SCM retractions at rest  HEENT:  NCAT, MMM  Cardiovascular:  RRR, nl S1, S2 no mrg, 2+ pulses, warm extremities  Respiratory:  Diminished bilateral BS to the mid-back and full expiratory wheeze at apices, no rales or rhonchi  Abdomen:   NABS, soft, NT/ND  MSK:   Normal tone and bulk, no  LEE  Neuro:  Grossly intact  Data Reviewed: Basic Metabolic Panel:  Recent Labs Lab 10/03/13 1910 10/04/13 0540 10/05/13 0500  NA 138 134* 140  K 3.8 3.5* 3.4*  CL 97 93* 100  CO2 29 21 28   GLUCOSE 88 235* 116*  BUN 16 16 19   CREATININE 0.76 0.69 0.76  CALCIUM 8.5 8.5 8.6   Liver Function Tests:  Recent Labs Lab 10/03/13 1910  AST 27  ALT 23  ALKPHOS 80  BILITOT 0.2*  PROT 6.8  ALBUMIN 3.6   No results found for this basename: LIPASE, AMYLASE,  in the last 168 hours No results found for this basename: AMMONIA,  in the last 168 hours CBC:  Recent Labs Lab 10/03/13 1910 10/04/13 0540 10/05/13 0500  WBC 2.6* 2.9* 6.1  NEUTROABS 1.4*  --   --   HGB 13.7 14.2 13.8  HCT 41.0 42.9 41.2  MCV 91.3 91.9 91.2  PLT 178 202 200   Cardiac Enzymes:  Recent Labs Lab 10/03/13 1910  CKTOTAL 191*  TROPONINI <0.30   BNP (last 3 results)  Recent Labs  10/03/13 1920  PROBNP 55.4   CBG: No results found for this basename: GLUCAP,  in the last 168 hours  Recent Results (from the past 240 hour(s))  MRSA  PCR SCREENING     Status: None   Collection Time    10/04/13  1:15 AM      Result Value Range Status   MRSA by PCR NEGATIVE  NEGATIVE Final   Comment:            The GeneXpert MRSA Assay (FDA     approved for NASAL specimens     only), is one component of a     comprehensive MRSA colonization     surveillance program. It is not     intended to diagnose MRSA     infection nor to guide or     monitor treatment for     MRSA infections.     Studies: No results found.  Scheduled Meds: . aspirin  325 mg Oral Daily  . darifenacin  15 mg Oral Daily  . dextromethorphan-guaiFENesin  1 tablet Oral BID  . donepezil  10 mg Oral QHS  . enoxaparin (LOVENOX) injection  40 mg Subcutaneous Q24H  . furosemide  20 mg Intravenous Once  . ipratropium-albuterol  3 mL Nebulization QID  . lisinopril  20 mg Oral Daily  . oseltamivir  75 mg Oral Daily  . predniSONE  40 mg  Oral Q breakfast  . sodium chloride  3 mL Intravenous Q12H  . sodium chloride  3 mL Intravenous Q12H   Continuous Infusions:   Active Problems:   Fall   Ear abrasion   Hypoxia   Wheezing    Time spent: 30 min    Susan Landry, [facility]  [facility] Pager [phone]. If 7PM-7AM, please contact night-coverage at [email or web address], password [facility] 10/06/2013, 2:37 PM  LOS: 3 days

## 2013-10-06 NOTE — Progress Notes (Signed)
Occupational Therapy Treatment Patient Details Name: Susan LatusJanet F Landry MRN: [id number] DOB: 1928/04/08 Today's Date: 10/06/2013 Time: [id number] OT Time Calculation (min): 24 min  OT Assessment / Plan / Recommendation  History of present illness     OT comments  Pt exhausted from changing clothes so many times due to urinating when she coughs  Follow Up Recommendations  Home health OT;SNF;Other (comment) (depending on families ability to help)             Frequency Min 2X/week      Plan Discharge plan remains appropriate    Precautions / Restrictions Precautions Precautions: Fall Restrictions Weight Bearing Restrictions: No       ADL  Toilet Transfer: Performed;Minimal assistance Toilet Transfer Method: Sit to stand;Stand pivot Toilet Transfer Equipment: Bedside commode Toileting - Clothing Manipulation and Hygiene: Minimal assistance Where Assessed - Toileting Clothing Manipulation and Hygiene: Standing Transfers/Ambulation Related to ADLs: Pt was soaked as each time she coughs she does urinate. OT called daugther and explained depends might be helpful.  Daughter agreed and will bring today.      OT Goals(current goals can now be found in the care plan section)    Visit Information  Last OT Received On: 10/05/13 Assistance Needed: +1          Cognition  Cognition Arousal/Alertness: Awake/alert Behavior During Therapy: [facility] ParkWFL for tasks assessed/performed Overall Cognitive Status: Within Functional Limits for tasks assessed    Mobility  Transfers Sit to Stand: Min assist General transfer comment: Pt needs extra time and cues to scoot forward prior to standing.            End of Session OT - End of Session Activity Tolerance: Patient tolerated treatment well Patient left: in chair;with call bell/phone within reach  GO     [facility] Spine HospitalREDDING, Metro KungLorraine D 10/06/2013, 11:29 AM

## 2013-10-07 LAB — BASIC METABOLIC PANEL
BUN: 19 mg/dL (ref 6–23)
CHLORIDE: 96 meq/L (ref 96–112)
CO2: 31 meq/L (ref 19–32)
Calcium: 8.5 mg/dL (ref 8.4–10.5)
Creatinine, Ser: 0.62 mg/dL (ref 0.50–1.10)
GFR calc Af Amer: >90 mL/min (ref >90)
GFR, EST NON AFRICAN AMERICAN: 81 mL/min — AB (ref >90)
Glucose, Bld: 86 mg/dL (ref 70–99)
POTASSIUM: 3.8 meq/L (ref 3.7–5.3)
Sodium: 138 mEq/L (ref 137–147)

## 2013-10-07 MED ORDER — LISINOPRIL 40 MG PO TABS
40.0000 mg | ORAL_TABLET | Freq: Every day | ORAL | Status: DC
  Administered 2013-10-07 – 2013-10-09 (×3): 40 mg via ORAL
  Filled 2013-10-07 (×3): qty 1

## 2013-10-07 MED ORDER — FUROSEMIDE 10 MG/ML IJ SOLN
40.0000 mg | Freq: Once | INTRAMUSCULAR | Status: AC
  Administered 2013-10-07: 40 mg via INTRAVENOUS
  Filled 2013-10-07: qty 4

## 2013-10-07 NOTE — Progress Notes (Signed)
Physical Therapy Treatment Patient Details Name: Susan LatusJanet F Landry MRN: [id number] DOB: Oct 01, 1927 Today's Date: 10/07/2013 Time: [id number] PT Time Calculation (min): 26 min  PT Assessment / Plan / Recommendation  History of Present Illness 80 yo female admitted with fall, hypoxia, flu+. Hx of cerebral aneurysm, HTN, mild cognitive impairment, dementia, obstructive lung disease. Pt lives in Ind Living with husband   PT Comments   Pt OOB in recliner on 1 lt O2 with sats 94%.  [facility] then amb in hallway on RA sats avg 90%.  Pt tolerated increased amb distance.   Follow Up Recommendations  Home health PT;Supervision/Assistance - 24 hour     Does the patient have the potential to tolerate intense rehabilitation     Barriers to Discharge        Equipment Recommendations  None recommended by PT    Recommendations for Other Services    Frequency Min 3X/week   Progress towards PT Goals Progress towards PT goals: Progressing toward goals  Plan      Precautions / Restrictions Precautions Precautions: Fall Restrictions Weight Bearing Restrictions: No    Pertinent Vitals/Pain No c/o pain    Mobility  Bed Mobility General bed mobility comments: Pt OOB in recliner Transfers Overall transfer level: Needs assistance Equipment used: 1 person hand held assist Transfers: Sit to/from Stand Sit to Stand: Min guard General transfer comment: verbal cues for hand placement and increased time Ambulation/Gait Ambulation/Gait assistance: Min guard Ambulation Distance (Feet): 340 Feet Assistive device: Rolling walker (2 wheeled) Gait Pattern/deviations: Decreased stride length;Trunk flexed Gait velocity: WFL General Gait Details: 50% VC's on proper walker to self distance and safety with turns.      PT Goals (current goals can now be found in the care plan section)    Visit Information  Last PT Received On: 10/07/13 Assistance Needed: +1 History of  Present Illness: 80 yo female admitted with fall, hypoxia, flu+. Hx of cerebral aneurysm, HTN, mild cognitive impairment, dementia, obstructive lung disease. Pt lives in Ind Living with husband    Subjective Data      Cognition       Balance     End of Session PT - End of Session Equipment Utilized During Treatment: Gait belt;Oxygen Activity Tolerance: Patient tolerated treatment well Patient left: in chair;with call bell/phone within reach   Felecia ShellingLori Kawana Hegel  PTA [facility]  Acute  Rehab Pager      [phone]

## 2013-10-07 NOTE — Progress Notes (Addendum)
[facility] PROGRESS NOTE  Ebbie LatusJanet F Siciliano ZOX:[id number] DOB: [birth year] DOA: 10/03/2013 PCP: Ginette OttoSTONEKING,HAL THOMAS, MD  Assessment/Plan  Acute hypoxic respiratory failure with wheezing may be due to bronchiolitis from influenza, acute asthma/COPD exacerbation, or acute diastolic heart failure.  Continue to treat for all three.  Influenza A.  -  continue Tamiflu day 4 -  Continue OOB and wean O2 as tolerated  Acute asthma/COPD exac, no hx of asthma or COPD and nonsmoker - Continue nebs -  Fast prednisone taper  Acute diastolic heart failure, minimal diuresis yesterday with 20mg  IV lasix.   -  Lasix 40mg  IV once daily -  Daily weights -  Strict I/O  Hypertension, BP mildly elevated.  Continue lisinopril  Dementia, stable.  Continue aricept  History of cerebral aneurysm, stable.  Hypokalemia, replaced and repeat BMP in AM  Diet:  regular Access:  PIV IVF:  off Proph:  lovenox  Code Status: DNR Family Communication: patient alone Disposition Plan: pending improvement in breathing, Home health PT/OT   Consultants:  none Procedures:  none Antibiotics:  none   HPI/Subjective:  Persistent cough, wheeze, SOB.    Objective: Filed Vitals:   10/06/13 2253 10/06/13 2340 10/07/13 0652 10/07/13 0825  BP: 155/93  108/77   Pulse: 72  76   Temp: 98.1 F (36.7 C)  97.7 F (36.5 C)   TempSrc: Oral  Oral   Resp: 20  22   Height:      Weight:      SpO2: 99% 94% 94% 90%    Intake/Output Summary (Last 24 hours) at 10/07/13 1151 Last data filed at 10/07/13 0845  Gross per 24 hour  Intake    720 ml  Output    375 ml  Net    345 ml   Filed Weights   10/04/13 0032  Weight: 78.5 kg (173 lb 1 oz)    Exam:   General:  CF, mild SCM retractions when talking  HEENT:  NCAT, MMM  Cardiovascular:  RRR, nl S1, S2 no mrg, 2+ pulses, warm extremities  Respiratory:  Diminished bilateral BS at bases, + faint wheeze, rales at bases bilaterallly, +  rhonchi  Abdomen:   NABS, soft, NT/ND  MSK:   Normal tone and bulk, no LEE  Neuro:  Grossly intact  Data Reviewed: Basic Metabolic Panel:  Recent Labs Lab 10/03/13 1910 10/04/13 0540 10/05/13 0500 10/07/13 0407  NA 138 134* 140 138  K 3.8 3.5* 3.4* 3.8  CL 97 93* 100 96  CO2 29 21 28 31   GLUCOSE 88 235* 116* 86  BUN 16 16 19 19   CREATININE 0.76 0.69 0.76 0.62  CALCIUM 8.5 8.5 8.6 8.5   Liver Function Tests:  Recent Labs Lab 10/03/13 1910  AST 27  ALT 23  ALKPHOS 80  BILITOT 0.2*  PROT 6.8  ALBUMIN 3.6   No results found for this basename: LIPASE, AMYLASE,  in the last 168 hours No results found for this basename: AMMONIA,  in the last 168 hours CBC:  Recent Labs Lab 10/03/13 1910 10/04/13 0540 10/05/13 0500  WBC 2.6* 2.9* 6.1  NEUTROABS 1.4*  --   --   HGB 13.7 14.2 13.8  HCT 41.0 42.9 41.2  MCV 91.3 91.9 91.2  PLT 178 202 200   Cardiac Enzymes:  Recent Labs Lab 10/03/13 1910  CKTOTAL 191*  TROPONINI <0.30   BNP (last 3 results)  Recent Labs  10/03/13 1920  PROBNP 55.4   CBG: No results found  for this basename: GLUCAP,  in the last 168 hours  Recent Results (from the past 240 hour(s))  MRSA PCR SCREENING     Status: None   Collection Time    10/04/13  1:15 AM      Result Value Range Status   MRSA by PCR NEGATIVE  NEGATIVE Final   Comment:            The GeneXpert MRSA Assay (FDA     approved for NASAL specimens     only), is one component of a     comprehensive MRSA colonization     surveillance program. It is not     intended to diagnose MRSA     infection nor to guide or     monitor treatment for     MRSA infections.     Studies: No results found.  Scheduled Meds: . aspirin  325 mg Oral Daily  . darifenacin  15 mg Oral Daily  . dextromethorphan-guaiFENesin  1 tablet Oral BID  . donepezil  10 mg Oral QHS  . enoxaparin (LOVENOX) injection  40 mg Subcutaneous Q24H  . ipratropium-albuterol  3 mL Nebulization QID  .  lisinopril  40 mg Oral Daily  . oseltamivir  75 mg Oral Daily  . predniSONE  40 mg Oral Q breakfast  . sodium chloride  3 mL Intravenous Q12H  . sodium chloride  3 mL Intravenous Q12H   Continuous Infusions:   Active Problems:   HYPERTENSION   Fall   Ear abrasion   Acute respiratory failure with hypoxia   Wheezing   Acute diastolic heart failure   Influenza with respiratory manifestations    Time spent: 30 min    Tj Kitchings, [facility]  [facility] Pager [phone]. If 7PM-7AM, please contact night-coverage at [email or web address], password [facility] 10/07/2013, 11:51 AM  LOS: 4 days

## 2013-10-08 ENCOUNTER — Inpatient Hospital Stay (HOSPITAL_COMMUNITY): Payer: Medicare Other

## 2013-10-08 LAB — D-DIMER, QUANTITATIVE: D-Dimer, Quant: 0.38 ug/mL-FEU (ref 0.00–0.48)

## 2013-10-08 LAB — BASIC METABOLIC PANEL
BUN: 22 mg/dL (ref 6–23)
CHLORIDE: 94 meq/L — AB (ref 96–112)
CO2: 29 mEq/L (ref 19–32)
CREATININE: 0.72 mg/dL (ref 0.50–1.10)
Calcium: 9 mg/dL (ref 8.4–10.5)
GFR calc Af Amer: 90 mL/min — ABNORMAL LOW (ref >90)
GFR calc non Af Amer: 77 mL/min — ABNORMAL LOW (ref >90)
GLUCOSE: 94 mg/dL (ref 70–99)
Potassium: 3.9 mEq/L (ref 3.7–5.3)
Sodium: 139 mEq/L (ref 137–147)

## 2013-10-08 MED ORDER — PREDNISONE 20 MG PO TABS
30.0000 mg | ORAL_TABLET | Freq: Every day | ORAL | Status: DC
  Administered 2013-10-09: 30 mg via ORAL
  Filled 2013-10-08 (×2): qty 1

## 2013-10-08 MED ORDER — FUROSEMIDE 10 MG/ML IJ SOLN
40.0000 mg | Freq: Once | INTRAMUSCULAR | Status: AC
  Administered 2013-10-08: 40 mg via INTRAVENOUS
  Filled 2013-10-08: qty 4

## 2013-10-08 MED ORDER — FUROSEMIDE 10 MG/ML IJ SOLN
40.0000 mg | Freq: Two times a day (BID) | INTRAMUSCULAR | Status: DC
  Administered 2013-10-08 – 2013-10-09 (×2): 40 mg via INTRAVENOUS
  Filled 2013-10-08 (×4): qty 4

## 2013-10-08 NOTE — Progress Notes (Signed)
[facility] PROGRESS NOTE  Susan Landry ZOX:[id number] DOB: Nov 23, 1927 DOA: 10/03/2013 PCP: Ginette Otto, MD  Assessment/Plan  Acute hypoxic respiratory failure with wheezing may be due to bronchiolitis from influenza, acute asthma/COPD exacerbation, or acute diastolic heart failure.  Minimal improvement with diuresis.  Influenza A.  -  continue Tamiflu day 5/5 -  Continue OOB and wean O2 as tolerated  Acute asthma/COPD exac, no hx of asthma or COPD and nonsmoker - Continue nebs -  Taper prednisone  Acute diastolic heart failure, improved diuresis with lasix 40mg  yesterday and BUN:Cr stable -  Foley (incontinent of urine otherwise) -  Lasix 40mg  IV BID today -  Daily weights -  Strict I/O -  CXR today -  Incentive spirometry -  OOB as much as possible  Hypertension, BP mildly elevated.  Continue lisinopril  Dementia, stable.  Continue aricept  History of cerebral aneurysm, stable.  Hypokalemia, replaced and repeat BMP in AM  Diet:  Low sodium with 1.2L fluid restriction Access:  PIV IVF:  off Proph:  lovenox  Code Status: DNR Family Communication: patient alone Disposition Plan: pending improvement in breathing, Home health PT/OT   Consultants:  none Procedures:  none Antibiotics:  none   HPI/Subjective:  Persistent cough, wheeze, SOB.    Objective: Filed Vitals:   10/08/13 0459 10/08/13 0731 10/08/13 1009 10/08/13 1138  BP: 151/88  144/75   Pulse: 82  70   Temp: 97.9 F (36.6 C)     TempSrc: Oral     Resp: 18     Height:      Weight:      SpO2: 92% 87%  100%    Intake/Output Summary (Last 24 hours) at 10/08/13 1151 Last data filed at 10/08/13 1000  Gross per 24 hour  Intake    240 ml  Output    450 ml  Net   -210 ml   Filed Weights   10/04/13 0032  Weight: 78.5 kg (173 lb 1 oz)    Exam:   General:  CF, mild SCM retractions when talking, stable from yesterday  HEENT:  NCAT, MMM  Cardiovascular:  RRR, nl S1, S2 no  mrg, 2+ pulses, warm extremities  Respiratory:  Diminished bilateral BS at bases, bilateral wheezes with rales to the apices, no rhonchi  Abdomen:   NABS, soft, NT/ND  MSK:   Normal tone and bulk, no LEE  Neuro:  Grossly intact  Data Reviewed: Basic Metabolic Panel:  Recent Labs Lab 10/03/13 1910 10/04/13 0540 10/05/13 0500 10/07/13 0407 10/08/13 0335  NA 138 134* 140 138 139  K 3.8 3.5* 3.4* 3.8 3.9  CL 97 93* 100 96 94*  CO2 29 21 28 31 29   GLUCOSE 88 235* 116* 86 94  BUN 16 16 19 19 22   CREATININE 0.76 0.69 0.76 0.62 0.72  CALCIUM 8.5 8.5 8.6 8.5 9.0   Liver Function Tests:  Recent Labs Lab 10/03/13 1910  AST 27  ALT 23  ALKPHOS 80  BILITOT 0.2*  PROT 6.8  ALBUMIN 3.6   No results found for this basename: LIPASE, AMYLASE,  in the last 168 hours No results found for this basename: AMMONIA,  in the last 168 hours CBC:  Recent Labs Lab 10/03/13 1910 10/04/13 0540 10/05/13 0500  WBC 2.6* 2.9* 6.1  NEUTROABS 1.4*  --   --   HGB 13.7 14.2 13.8  HCT 41.0 42.9 41.2  MCV 91.3 91.9 91.2  PLT 178 202 200   Cardiac Enzymes:  Recent Labs Lab 10/03/13 1910  CKTOTAL 191*  TROPONINI <0.30   BNP (last 3 results)  Recent Labs  10/03/13 1920  PROBNP 55.4   CBG: No results found for this basename: GLUCAP,  in the last 168 hours  Recent Results (from the past 240 hour(s))  MRSA PCR SCREENING     Status: None   Collection Time    10/04/13  1:15 AM      Result Value Range Status   MRSA by PCR NEGATIVE  NEGATIVE Final   Comment:            The GeneXpert MRSA Assay (FDA     approved for NASAL specimens     only), is one component of a     comprehensive MRSA colonization     surveillance program. It is not     intended to diagnose MRSA     infection nor to guide or     monitor treatment for     MRSA infections.     Studies: No results found.  Scheduled Meds: . aspirin  325 mg Oral Daily  . darifenacin  15 mg Oral Daily  .  dextromethorphan-guaiFENesin  1 tablet Oral BID  . donepezil  10 mg Oral QHS  . enoxaparin (LOVENOX) injection  40 mg Subcutaneous Q24H  . furosemide  40 mg Intravenous BID  . ipratropium-albuterol  3 mL Nebulization QID  . lisinopril  40 mg Oral Daily  . predniSONE  40 mg Oral Q breakfast  . sodium chloride  3 mL Intravenous Q12H  . sodium chloride  3 mL Intravenous Q12H   Continuous Infusions:   Active Problems:   HYPERTENSION   Fall   Ear abrasion   Acute respiratory failure with hypoxia   Wheezing   Acute diastolic heart failure   Influenza with respiratory manifestations    Time spent: 30 min    Susan Landry, [facility]  [facility] Pager [phone]. If 7PM-7AM, please contact night-coverage at [email or web address], password [email or web address] 10/08/2013, 11:51 AM  LOS: 5 days

## 2013-10-08 NOTE — Progress Notes (Signed)
SATURATION QUALIFICATIONS: (This note is used to comply with regulatory documentation for home oxygen)  Patient Saturations on Room Air at Rest = 95%  Patient Saturations on Room Air while Ambulating = 94%  Patient Saturations on zero Liters of oxygen while Ambulating = 94%  Please briefly explain why patient needs home oxygen:

## 2013-10-09 LAB — BASIC METABOLIC PANEL
BUN: 35 mg/dL — AB (ref 6–23)
CHLORIDE: 93 meq/L — AB (ref 96–112)
CO2: 29 meq/L (ref 19–32)
CREATININE: 1.01 mg/dL (ref 0.50–1.10)
Calcium: 9.1 mg/dL (ref 8.4–10.5)
GFR calc Af Amer: 58 mL/min — ABNORMAL LOW (ref >90)
GFR calc non Af Amer: 50 mL/min — ABNORMAL LOW (ref >90)
Glucose, Bld: 89 mg/dL (ref 70–99)
POTASSIUM: 3.6 meq/L — AB (ref 3.7–5.3)
Sodium: 138 mEq/L (ref 137–147)

## 2013-10-09 MED ORDER — PREDNISONE 10 MG PO TABS
ORAL_TABLET | ORAL | Status: DC
Start: 2013-10-09 — End: 2014-05-04

## 2013-10-09 MED ORDER — ALBUTEROL SULFATE (2.5 MG/3ML) 0.083% IN NEBU: 2.5000 mg | INHALATION_SOLUTION | RESPIRATORY_TRACT | Status: DC | PRN

## 2013-10-09 MED ORDER — IPRATROPIUM BROMIDE 0.02 % IN SOLN: 0.5000 mg | RESPIRATORY_TRACT | Status: DC | PRN

## 2013-10-09 MED ORDER — IPRATROPIUM-ALBUTEROL 0.5-2.5 (3) MG/3ML IN SOLN: 3.0000 mL | RESPIRATORY_TRACT | Status: DC | PRN

## 2013-10-09 NOTE — Discharge Summary (Signed)
Physician Discharge Summary  Susan Landry ZOX:[id number] DOB: Feb 06, 1928 DOA: 10/03/2013  PCP: Ginette Otto, MD  Admit date: 10/03/2013 Discharge date: 10/09/2013  Recommendations for Outpatient Follow-up:  1. Follow up with primary care doctor in 1 week for repeat examination.  Consider referral for PFTs once recovered from acute illness.    Discharge Diagnoses:  Active Problems:   HYPERTENSION   Fall   Ear abrasion   Acute respiratory failure with hypoxia   Wheezing   Acute diastolic heart failure   Influenza with respiratory manifestations   Discharge Condition: stable, improved  Diet recommendation: healthy heart, low sodium  Wt Readings from Last 3 Encounters:  10/09/13 73.483 kg (162 lb)  07/23/11 76.204 kg (168 lb)  06/11/11 77.565 kg (171 lb)    History of present illness:  Pt w/ hx of occult obstructive lung disease, cerebral aneurysm s/p clip, CSF shunt, HTN who presents after a fall the night prior. Upon questioning the patient has been having persistant wheezing for the past few weeks. Over the past few days this has developed into a dry cough. Denies fevers, chills, body aches. Then last night she was reaching for something at the night stand and fell off bed. Hit temple on the night stand. Was then too weak to get up so she laid on the floor until found by husband this AM. She denies LOC, HA, or focal deficits.  In the ED, CT head unremarkable for acute findings. CXR unremarkable. Labs only significant for mildly elevated CK w/ normal GFR and leukopenia. Her vitals significant for sating 88% on RA that improved on 4L [place] to > 92%. Also sinus tachycardia after receiving 125mg  IV solumedrol and extensive albuterol neb treatments.  Patient was lucid and oriented and requested to be DO NOT RESUSCITATE    Hospital Course:   Acute hypoxic respiratory failure with wheezing may be due to bronchiolitis from influenza, acute asthma/COPD exacerbation, or acute  diastolic heart failure. Minimal improvement with diuresis.  She completed a 5-day course of tamiflu.  Although she does not have history of COPD or asthma, her wheezing was concerning for possible underlying obstructive disease.  She was started on a steroid taper and duonebs which seemed to improve her SOB.  She should continue a Orel Cooler taper of steroids at home and was arranged for a home nebulizer machine.  She was slow to transition to room air, so she was also diuresed with lasix to address any underlying acute diastolic heart failure.  Her weight decreased by approximately 8-lbs.  Strict I/O were not recorded secondary to urinary incontinence.  She was determined to be euvolemic to hypovolemic when her BUN rose.  Repeat CXR demonstrated NAD and her D-dimer was negative.  She did not qualify for oxygen at rest or with ambulation.    Hypertension, BP mildly elevated. Continued lisinopril. Dementia, stable. Continue aricept.   History of cerebral aneurysm, stable.  Hypokalemia, replaced and repeat BMP in AM  Consultants:  none Procedures:  none Antibiotics:  none   Discharge Exam: Filed Vitals:   10/09/13 0613  BP: 124/78  Pulse: 93  Temp: 98.4 F (36.9 C)  Resp: 21   Filed Vitals:   10/08/13 1942 10/08/13 2336 10/09/13 0613 10/09/13 0756  BP:  124/77 124/78   Pulse:  103 93   Temp:  98.1 F (36.7 C) 98.4 F (36.9 C)   TempSrc:  Oral Oral   Resp:  24 21   Height:      Weight:  73.483 kg (162 lb)   SpO2: 90% 91% 90% 91%    General: CF, NAD HEENT: NCAT, MMM  Cardiovascular: RRR, nl S1, S2 no mrg, 2+ pulses, warm extremities  Respiratory: Diminished bilateral BS at bases, rhonchi throughout, no wheezes or rales  Abdomen: NABS, soft, NT/ND  MSK: Normal tone and bulk, no LEE  Neuro: Grossly intact   Discharge Instructions      Discharge Orders   Future Orders Complete By Expires   (HEART FAILURE PATIENTS) Call MD:  Anytime you have any of the following symptoms: 1)  3 pound weight gain in 24 hours or 5 pounds in 1 week 2) shortness of breath, with or without a dry hacking cough 3) swelling in the hands, feet or stomach 4) if you have to sleep on extra pillows at night in order to breathe.  As directed    Call MD for:  difficulty breathing, headache or visual disturbances  As directed    Call MD for:  extreme fatigue  As directed    Call MD for:  hives  As directed    Call MD for:  persistant dizziness or light-headedness  As directed    Call MD for:  persistant nausea and vomiting  As directed    Call MD for:  severe uncontrolled pain  As directed    Call MD for:  temperature >100.4  As directed    Diet - low sodium heart healthy  As directed    Discharge instructions  As directed    Comments:     You were hospitalized with shortness of breath from influenza.  You have completed your treatment for flu.  You were also treated for possible asthma given your persistent wheezing.  Please continue to take a tapering dose of prednisone for the next 6 days and use your nebulizer machine for shortness of breath or wheezing.  You also may have some diastolic heart failure, or stiffness of the heart, which can cause fluid retention.  You were given lasix, a diuretic, to try to improve your wheezing.  You will not need to continue this medication at home at this time, however, please weigh yourself daily and if you gain more than 3-lbs in one day or 5-lbs in one week or if you notice swelling of your ankles or feet, please call your doctor.  I have recommended that you have some testing done for asthma once you have fully recovered from this illness.  Your primary care doctor can take care of ordering this test if they feel it is indicated.  If you feel more Aedan Geimer of breath or have chest pain, please call 911.   Increase activity slowly  As directed        Medication List         albuterol (2.5 MG/3ML) 0.083% nebulizer solution  Commonly known as:  PROVENTIL  Take 3  mLs (2.5 mg total) by nebulization every 4 (four) hours as needed for wheezing or shortness of breath.     aspirin 325 MG tablet  Take 325 mg by mouth daily.     CALCIUM 600 + D PO  Take by mouth daily.     donepezil 10 MG tablet  Commonly known as:  ARICEPT  Take 10 mg by mouth at bedtime.     fosinopril 20 MG tablet  Commonly known as:  MONOPRIL  Take 1 tablet (20 mg total) by mouth daily.     ipratropium 0.02 % nebulizer solution  Commonly known as:  ATROVENT  Take 2.5 mLs (0.5 mg total) by nebulization every 4 (four) hours as needed for wheezing or shortness of breath.     multivitamin tablet  Take 1 tablet by mouth daily.     predniSONE 10 MG tablet  Commonly known as:  DELTASONE  Take 3 tabs daily x 2 days, 2 tabs daily x 2 days, 1 tab daily x 2 days, then stop.     solifenacin 10 MG tablet  Commonly known as:  VESICARE  Take 10 mg by mouth daily.       Follow-up Information   Follow up with Ginette Otto, MD. Schedule an appointment as soon as possible for a visit in 1 week.   Specialty:  Internal Medicine   Contact information:   [address] [place] [address] [phone]        The results of significant diagnostics from this hospitalization (including imaging, microbiology, ancillary and laboratory) are listed below for reference.    Significant Diagnostic Studies: Dg Chest 2 View  10/03/2013   CLINICAL DATA:  Cough and wheezing.  EXAM: CHEST  2 VIEW  COMPARISON:  04/13/2013, 04/10/2013 and 12/25/2004 Heart size is normal. Main pulmonary arteries are chronically slightly prominent. Chronic elevation of the right hemidiaphragm. There is slight scarring at the left lung base laterally. There is slight peribronchial thickening in and atelectasis at the right lung base.  Ventriculoperitoneal shunt tube is noted crossing the right side of the chest. Thoracolumbar scoliosis, unchanged. No acute osseous abnormality.  FINDINGS: Bronchitic  changes in slight atelectasis on the right.  IMPRESSION: No active cardiopulmonary disease.   Electronically Signed   By: Geanie Cooley M.D.   On: 10/03/2013 18:53   Ct Head Wo Contrast  10/03/2013   CLINICAL DATA:  Fall.  History of aneurysm.  EXAM: CT HEAD WITHOUT CONTRAST  TECHNIQUE: Contiguous axial images were obtained from the base of the skull through the vertex without intravenous contrast.  COMPARISON:  04/05/2010.  FINDINGS: No skull fracture or intracranial hemorrhage.  Remote right temporal craniectomy with placement of aneurysm clip right cavernous sinus region. Encephalomalacia right temporal lobe felt to be related to the operative approach.  Remote left parietal-occipital lobe infarct.  Small vessel disease type changes without CT evidence of large acute infarct.  Ventricular shunt enters from the right frontal region with tip in the right frontal horn. No evidence of hydrocephalus.  No intracranial mass lesion noted on this unenhanced exam.  Mastoid air cells, middle ear cavities and visualized paranasal sinuses are clear.  IMPRESSION: Postoperative changes as noted above without evidence of acute fracture or intracranial hemorrhage.   Electronically Signed   By: Bridgett Larsson M.D.   On: 10/03/2013 18:47   Dg Chest Port 1 View  10/08/2013   CLINICAL DATA:  persistent hypoxia  EXAM: PORTABLE CHEST - 1 VIEW  COMPARISON:  DG CHEST 2 VIEW dated 10/03/2013  FINDINGS: There is elevation of the right diaphragm. There is no focal parenchymal opacity, pleural effusion, or pneumothorax. The heart and mediastinal contours are unremarkable. There is ventriculoperitoneal shunt tubing intact.  The osseous structures are unremarkable.  IMPRESSION: No active disease.   Electronically Signed   By: Elige Ko   On: 10/08/2013 13:48    Microbiology: Recent Results (from the past 240 hour(s))  MRSA PCR SCREENING     Status: None   Collection Time    10/04/13  1:15 AM  Result Value Range Status   MRSA  by PCR NEGATIVE  NEGATIVE Final   Comment:            The GeneXpert MRSA Assay (FDA     approved for NASAL specimens     only), is one component of a     comprehensive MRSA colonization     surveillance program. It is not     intended to diagnose MRSA     infection nor to guide or     monitor treatment for     MRSA infections.     Labs: Basic Metabolic Panel:  Recent Labs Lab 10/04/13 0540 10/05/13 0500 10/07/13 0407 10/08/13 0335 10/09/13 0515  NA 134* 140 138 139 138  K 3.5* 3.4* 3.8 3.9 3.6*  CL 93* 100 96 94* 93*  CO2 21 28 31 29 29   GLUCOSE 235* 116* 86 94 89  BUN 16 19 19 22  35*  CREATININE 0.69 0.76 0.62 0.72 1.01  CALCIUM 8.5 8.6 8.5 9.0 9.1   Liver Function Tests:  Recent Labs Lab 10/03/13 1910  AST 27  ALT 23  ALKPHOS 80  BILITOT 0.2*  PROT 6.8  ALBUMIN 3.6   No results found for this basename: LIPASE, AMYLASE,  in the last 168 hours No results found for this basename: AMMONIA,  in the last 168 hours CBC:  Recent Labs Lab 10/03/13 1910 10/04/13 0540 10/05/13 0500  WBC 2.6* 2.9* 6.1  NEUTROABS 1.4*  --   --   HGB 13.7 14.2 13.8  HCT 41.0 42.9 41.2  MCV 91.3 91.9 91.2  PLT 178 202 200   Cardiac Enzymes:  Recent Labs Lab 10/03/13 1910  CKTOTAL 191*  TROPONINI <0.30   BNP: BNP (last 3 results)  Recent Labs  10/03/13 1920  PROBNP 55.4   CBG: No results found for this basename: GLUCAP,  in the last 168 hours  Time coordinating discharge: 45 minutes  Signed:  Arrington Bencomo  [facility] 10/09/2013, 8:59 AM

## 2013-11-18 ENCOUNTER — Encounter: Payer: Self-pay | Admitting: Cardiovascular Disease

## 2013-11-18 ENCOUNTER — Other Ambulatory Visit (HOSPITAL_COMMUNITY): Payer: Self-pay | Admitting: Geriatric Medicine

## 2013-11-18 ENCOUNTER — Ambulatory Visit (HOSPITAL_COMMUNITY): Payer: Medicare Other | Attending: Cardiovascular Disease | Admitting: Radiology

## 2013-11-18 DIAGNOSIS — R609 Edema, unspecified: Secondary | ICD-10-CM | POA: Insufficient documentation

## 2013-11-18 DIAGNOSIS — I5031 Acute diastolic (congestive) heart failure: Secondary | ICD-10-CM

## 2013-11-18 NOTE — Progress Notes (Signed)
Echocardiogram performed.  

## 2014-05-04 ENCOUNTER — Inpatient Hospital Stay (HOSPITAL_COMMUNITY)
Admission: EM | Admit: 2014-05-04 | Discharge: 2014-05-05 | DRG: 916 | Disposition: A | Discharge status: Home / Self Care | Payer: Medicare Other | Attending: Internal Medicine | Admitting: Internal Medicine

## 2014-05-04 ENCOUNTER — Encounter (HOSPITAL_COMMUNITY): Payer: Self-pay | Admitting: Emergency Medicine

## 2014-05-04 DIAGNOSIS — Z7982 Long term (current) use of aspirin: Secondary | ICD-10-CM

## 2014-05-04 DIAGNOSIS — G3184 Mild cognitive impairment, so stated: Secondary | ICD-10-CM

## 2014-05-04 DIAGNOSIS — Y92009 Unspecified place in unspecified non-institutional (private) residence as the place of occurrence of the external cause: Secondary | ICD-10-CM

## 2014-05-04 DIAGNOSIS — N318 Other neuromuscular dysfunction of bladder: Secondary | ICD-10-CM

## 2014-05-04 DIAGNOSIS — I5031 Acute diastolic (congestive) heart failure: Secondary | ICD-10-CM

## 2014-05-04 DIAGNOSIS — J111 Influenza due to unidentified influenza virus with other respiratory manifestations: Secondary | ICD-10-CM

## 2014-05-04 DIAGNOSIS — F0390 Unspecified dementia without behavioral disturbance: Secondary | ICD-10-CM | POA: Diagnosis present

## 2014-05-04 DIAGNOSIS — T783XXA Angioneurotic edema, initial encounter: Secondary | ICD-10-CM | POA: Diagnosis not present

## 2014-05-04 DIAGNOSIS — T46905A Adverse effect of unspecified agents primarily affecting the cardiovascular system, initial encounter: Secondary | ICD-10-CM | POA: Diagnosis present

## 2014-05-04 DIAGNOSIS — J9601 Acute respiratory failure with hypoxia: Secondary | ICD-10-CM

## 2014-05-04 DIAGNOSIS — I10 Essential (primary) hypertension: Secondary | ICD-10-CM | POA: Diagnosis present

## 2014-05-04 DIAGNOSIS — W19XXXD Unspecified fall, subsequent encounter: Secondary | ICD-10-CM

## 2014-05-04 DIAGNOSIS — Z96659 Presence of unspecified artificial knee joint: Secondary | ICD-10-CM

## 2014-05-04 DIAGNOSIS — M1990 Unspecified osteoarthritis, unspecified site: Secondary | ICD-10-CM | POA: Diagnosis present

## 2014-05-04 DIAGNOSIS — R0609 Other forms of dyspnea: Secondary | ICD-10-CM

## 2014-05-04 DIAGNOSIS — T783XXS Angioneurotic edema, sequela: Secondary | ICD-10-CM

## 2014-05-04 DIAGNOSIS — R062 Wheezing: Secondary | ICD-10-CM

## 2014-05-04 LAB — URINALYSIS, ROUTINE W REFLEX MICROSCOPIC
Bilirubin Urine: NEGATIVE
Glucose, UA: NEGATIVE mg/dL
Hgb urine dipstick: NEGATIVE
Ketones, ur: NEGATIVE mg/dL
NITRITE: NEGATIVE
PH: 8 (ref 5.0–8.0)
Protein, ur: NEGATIVE mg/dL
SPECIFIC GRAVITY, URINE: 1.009 (ref 1.005–1.030)
Urobilinogen, UA: 0.2 mg/dL (ref 0.0–1.0)

## 2014-05-04 LAB — CBC
HCT: 42 % (ref 36.0–46.0)
Hemoglobin: 14.2 g/dL (ref 12.0–15.0)
MCH: 30.7 pg (ref 26.0–34.0)
MCHC: 33.8 g/dL (ref 30.0–36.0)
MCV: 90.9 fL (ref 78.0–100.0)
PLATELETS: 251 10*3/uL (ref 150–400)
RBC: 4.62 MIL/uL (ref 3.87–5.11)
RDW: 14.2 % (ref 11.5–15.5)
WBC: 7.2 10*3/uL (ref 4.0–10.5)

## 2014-05-04 LAB — URINE MICROSCOPIC-ADD ON

## 2014-05-04 LAB — BASIC METABOLIC PANEL
Anion gap: 13 (ref 5–15)
BUN: 25 mg/dL — ABNORMAL HIGH (ref 6–23)
CHLORIDE: 99 meq/L (ref 96–112)
CO2: 28 meq/L (ref 19–32)
Calcium: 9.4 mg/dL (ref 8.4–10.5)
Creatinine, Ser: 0.85 mg/dL (ref 0.50–1.10)
GFR calc Af Amer: 71 mL/min — ABNORMAL LOW (ref >90)
GFR calc non Af Amer: 61 mL/min — ABNORMAL LOW (ref >90)
Glucose, Bld: 123 mg/dL — ABNORMAL HIGH (ref 70–99)
POTASSIUM: 4.1 meq/L (ref 3.7–5.3)
SODIUM: 140 meq/L (ref 137–147)

## 2014-05-04 MED ORDER — ONDANSETRON HCL 4 MG/2ML IJ SOLN
4.0000 mg | Freq: Four times a day (QID) | INTRAMUSCULAR | Status: DC | PRN
Start: 2014-05-04 — End: 2014-05-05

## 2014-05-04 MED ORDER — ONDANSETRON HCL 4 MG PO TABS: 4.0000 mg | ORAL_TABLET | Freq: Four times a day (QID) | ORAL | Status: DC | PRN

## 2014-05-04 MED ORDER — ADULT MULTIVITAMIN W/MINERALS CH
1.0000 | ORAL_TABLET | Freq: Every day | ORAL | Status: DC
  Administered 2014-05-04: 1 via ORAL
  Filled 2014-05-04 (×2): qty 1

## 2014-05-04 MED ORDER — ASPIRIN 325 MG PO TABS
325.0000 mg | ORAL_TABLET | Freq: Every day | ORAL | Status: DC
  Administered 2014-05-04 – 2014-05-05 (×2): 325 mg via ORAL
  Filled 2014-05-04 (×2): qty 1

## 2014-05-04 MED ORDER — ACETAMINOPHEN 325 MG PO TABS: 650.0000 mg | ORAL_TABLET | Freq: Four times a day (QID) | ORAL | Status: DC | PRN

## 2014-05-04 MED ORDER — METHYLPREDNISOLONE SODIUM SUCC 125 MG IJ SOLR: 60.0000 mg | INTRAMUSCULAR | Status: DC

## 2014-05-04 MED ORDER — DONEPEZIL HCL 10 MG PO TABS
10.0000 mg | ORAL_TABLET | Freq: Every day | ORAL | Status: DC
  Administered 2014-05-04: 10 mg via ORAL
  Filled 2014-05-04 (×2): qty 1

## 2014-05-04 MED ORDER — IPRATROPIUM BROMIDE 0.02 % IN SOLN: 0.5000 mg | RESPIRATORY_TRACT | Status: DC | PRN

## 2014-05-04 MED ORDER — DIPHENHYDRAMINE HCL 50 MG/ML IJ SOLN: 12.5000 mg | Freq: Four times a day (QID) | INTRAMUSCULAR | Status: DC | PRN

## 2014-05-04 MED ORDER — HYDROCODONE-ACETAMINOPHEN 5-325 MG PO TABS: 1.0000 | ORAL_TABLET | ORAL | Status: DC | PRN

## 2014-05-04 MED ORDER — SODIUM CHLORIDE 0.9 % IV SOLN
INTRAVENOUS | Status: AC
  Administered 2014-05-04: 13:00:00 via INTRAVENOUS

## 2014-05-04 MED ORDER — METHYLPREDNISOLONE SODIUM SUCC 125 MG IJ SOLR
60.0000 mg | INTRAMUSCULAR | Status: DC
  Filled 2014-05-04: qty 0.96

## 2014-05-04 MED ORDER — ALBUTEROL SULFATE (2.5 MG/3ML) 0.083% IN NEBU: 2.5000 mg | INHALATION_SOLUTION | RESPIRATORY_TRACT | Status: DC | PRN

## 2014-05-04 MED ORDER — ACETAMINOPHEN 650 MG RE SUPP: 650.0000 mg | Freq: Four times a day (QID) | RECTAL | Status: DC | PRN

## 2014-05-04 MED ORDER — FAMOTIDINE IN NACL 20-0.9 MG/50ML-% IV SOLN
20.0000 mg | Freq: Two times a day (BID) | INTRAVENOUS | Status: DC
  Administered 2014-05-04 (×2): 20 mg via INTRAVENOUS
  Filled 2014-05-04 (×3): qty 50

## 2014-05-04 MED ORDER — FUROSEMIDE 40 MG PO TABS
40.0000 mg | ORAL_TABLET | Freq: Every day | ORAL | Status: DC
  Administered 2014-05-05: 40 mg via ORAL
  Filled 2014-05-04 (×2): qty 1

## 2014-05-04 MED ORDER — METHYLPREDNISOLONE SODIUM SUCC 125 MG IJ SOLR
125.0000 mg | Freq: Once | INTRAMUSCULAR | Status: AC
  Administered 2014-05-04: 125 mg via INTRAVENOUS
  Filled 2014-05-04: qty 2

## 2014-05-04 NOTE — Progress Notes (Signed)
UR completed 

## 2014-05-04 NOTE — H&P (Signed)
[facility] History and Physical  REATA PETROV UJW:[id number] DOB: February 20, 1928 DOA: 05/04/2014  Referring physician: ER physician PCP: Ginette Otto, MD   Chief Complaint: Tongue swelling  HPI:  80 year old female with past medical history hypertension, dementia who presented to [facility] ED 05/04/2014 with reports of sudden onset of tonsil swelling started this morning prior to this admission. Patient reported some difficulty swallowing but there was no respiratory distress. No other complaints of facial swelling or lip swelling. No hives. Patient reported previous reactions which would involve her lips. No complaints of chest pain, shortness of breath or palpitations. No abdominal pain, nausea or vomiting. In ED, vital signs are stable. Blood work is unremarkable. Patient will be started on steroids, Pepcid and Benadryl. Fosinopril stopped.  Assessment & Plan    Active Problems:   Angioedema  ACE inhibitor allergy, fosinopril stopped  Start Solu-Medrol 125 mg one dose, followed by Solu-Medrol 60 mg IV daily. Start Pepcid 20 mg IV twice a day, Benadryl 12.5 mg IV as needed every 6 hours for allergies or itching.  Observe for next 24 hours   Hypertension, essential  Hold fosinopril but may continue Lasix.  DVT prophylaxis:   SCDs bilaterally  Radiological Exams on Admission: No results found.  Code Status: Full Family Communication: Plan of care discussed with the patient  Disposition Plan: Admit for further evaluation  Manson Passey, MD  [facility] Pager [phone]  Review of Systems:  Constitutional: Negative for fever, chills and malaise/fatigue. Negative for diaphoresis.  HENT: Negative for hearing loss, ear pain, nosebleeds, congestion, sore throat, neck pain, tinnitus and ear discharge.   Eyes: Negative for blurred vision, double vision, photophobia, pain, discharge and redness.  Respiratory: Negative for cough, hemoptysis, sputum production, shortness  of breath, wheezing and stridor.   Cardiovascular: Negative for chest pain, palpitations, orthopnea, claudication and leg swelling.  Gastrointestinal: Negative for nausea, vomiting and abdominal pain. Negative for heartburn, constipation, blood in stool and melena.  Genitourinary: Negative for dysuria, urgency, frequency, hematuria and flank pain.  Musculoskeletal: Negative for myalgias, back pain, joint pain and falls.  Skin: Negative for itching and rash.  Neurological: Negative for dizziness and weakness. Negative for tingling, tremors, sensory change, speech change, focal weakness, loss of consciousness and headaches.  Endo/Heme/Allergies: Negative for environmental allergies and polydipsia. Does not bruise/bleed easily.  Psychiatric/Behavioral: Negative for suicidal ideas. The patient is not nervous/anxious.      Past Medical History  Diagnosis Date  . HYPERTENSION 07/14/2007  . MILD COGNITIVE IMPAIRMENT SO STATED 07/13/2009  . OSTEOARTHRITIS 01/12/2008  . OVERACTIVE BLADDER 07/14/2008  . Cerebral aneurysm    Past Surgical History  Procedure Laterality Date  . Tonsillectomy    . Total knee arthroplasty    . Csf shunt      cerebral aneurysm  . Breast surgery      abcess   Social History:  reports that she has never smoked. She has never used smokeless tobacco. She reports that she drinks alcohol. She reports that she does not use illicit drugs.  No Known Allergies  Family History:  Family History  Problem Relation Age of Onset  . Cancer Father      Prior to Admission medications   Medication Sig Start Date End Date Taking? Authorizing Provider  albuterol (PROVENTIL) (2.5 MG/3ML) 0.083% nebulizer solution Take 2.5 mg by nebulization every 4 (four) hours as needed for wheezing or shortness of breath.   Yes Historical Provider, MD  aspirin 325 MG tablet Take 325 mg  by mouth daily.     Yes Historical Provider, MD  Calcium Carbonate-Vitamin D (CALCIUM 600 + D PO) Take 1 tablet by  mouth daily.    Yes Historical Provider, MD  clindamycin (CLEOCIN) 150 MG capsule Take 600 mg by mouth See admin instructions. Takes  1 hour prior to dental appointment   Yes Historical Provider, MD  donepezil (ARICEPT) 10 MG tablet Take 10 mg by mouth at bedtime.   Yes Historical Provider, MD  fosinopril (MONOPRIL) 20 MG tablet Take 20 mg by mouth daily.   Yes Historical Provider, MD  furosemide (LASIX) 40 MG tablet Take 40 mg by mouth daily with breakfast.    Yes Historical Provider, MD  ipratropium (ATROVENT) 0.02 % nebulizer solution Take 0.5 mg by nebulization every 4 (four) hours as needed for wheezing or shortness of breath.   Yes Historical Provider, MD  Multiple Vitamin (MULTIVITAMIN) tablet Take 1 tablet by mouth at bedtime.    Yes Historical Provider, MD   Physical Exam: Filed Vitals:   05/04/14 0903  BP: 142/82  Pulse: 78  Temp: 98.4 F (36.9 C)  TempSrc: Oral  Resp: 16  SpO2: 100%    Physical Exam  Constitutional: Appears well-developed and well-nourished. No distress.  HENT: Normocephalic. No tonsillar erythema or exudates Eyes: Conjunctivae and EOM are normal. PERRLA, no scleral icterus.  Neck: Normal ROM. Neck supple. No JVD. No tracheal deviation. No thyromegaly.  CVS: RRR, S1/S2 +, no murmurs, no gallops, no carotid bruit.  Pulmonary: Effort and breath sounds normal, no stridor, rhonchi, wheezes, rales.  Abdominal: Soft. BS +,  no distension, tenderness, rebound or guarding.  Musculoskeletal: Normal range of motion. No edema and no tenderness.  Lymphadenopathy: No lymphadenopathy noted, cervical, inguinal. Neuro: Alert. Normal reflexes, muscle tone coordination. No focal neurologic deficits. Skin: Skin is warm and dry. No rash noted. Not diaphoretic. No erythema. No pallor.  Psychiatric: Normal mood and affect. Behavior, judgment, thought content normal.   Labs on Admission:  Basic Metabolic Panel:  Recent Labs Lab 05/04/14 0951  NA 140  K 4.1  CL 99   CO2 28  GLUCOSE 123*  BUN 25*  CREATININE 0.85  CALCIUM 9.4   Liver Function Tests: No results found for this basename: AST, ALT, ALKPHOS, BILITOT, PROT, ALBUMIN,  in the last 168 hours No results found for this basename: LIPASE, AMYLASE,  in the last 168 hours No results found for this basename: AMMONIA,  in the last 168 hours CBC:  Recent Labs Lab 05/04/14 0951  WBC 7.2  HGB 14.2  HCT 42.0  MCV 90.9  PLT 251   Cardiac Enzymes: No results found for this basename: CKTOTAL, CKMB, CKMBINDEX, TROPONINI,  in the last 168 hours BNP: No components found with this basename: POCBNP,  CBG: No results found for this basename: GLUCAP,  in the last 168 hours  If 7PM-7AM, please contact night-coverage [email or web address] Password [facility] 05/04/2014, 11:41 AM

## 2014-05-04 NOTE — ED Notes (Signed)
Called to give report. Nurse will call back. Will try again in 10 min.

## 2014-05-04 NOTE — ED Notes (Signed)
Bed: WA17 Expected date:  Expected time:  Means of arrival:  Comments: EMS allergy-type symptoms

## 2014-05-04 NOTE — ED Notes (Signed)
Sehe states she awakened at ~0400 this morning to find her tongue to be swollen.  She states she has had reactions in the past which involved her lips, but not her tongue.  She reports some difficulty swallowing; and her breathing is without difficulty.

## 2014-05-04 NOTE — ED Notes (Signed)
Called to give report. Nurse states she will call back. 

## 2014-05-04 NOTE — ED Provider Notes (Signed)
Medical screening examination/treatment/procedure(s) were conducted as a shared visit with non-physician practitioner(s) and myself.  I personally evaluated the patient during the encounter.   EKG Interpretation None     Patient here with tongue swelling that began this morning. She does take an ACE inhibitor. Denies any rashes at this time. Exam patient's airway is intact. Mild tongue edema noted. No stridor appreciated. No wheezing noted. Plan will be to observe and likely admission  Toy Baker, MD 05/04/14 [phone]

## 2014-05-04 NOTE — ED Provider Notes (Signed)
CSN: [id number]     Arrival date & time 05/04/14  8295 History   First MD Initiated Contact with Patient 05/04/14 [phone]     Chief Complaint  Patient presents with  . Allergic Reaction     HPI Susan Landry is a 80 y.o. female with PMH of HTN complaining of swelling of her tongue when she woke up at 4am this morning. She takes lisinopril and has for years. She reports increasing difficulty swallowing but no difficulty breathing. Patient reports no swelling of lips or face or tingling in lips. She has had facial and lip swelling after eating shellfish but she denies eating any recently. She did have some on Friday and her daughter noted that she had facial and lip swelling and she took benadryl which resolved her swelling. Patient notes no fevers, recent illnesses, rashes, CP, abdominal pain, back pain, weakness. She notes SOB but it is chronic and not above baseline.   Past Medical History  Diagnosis Date  . HYPERTENSION 07/14/2007  . MILD COGNITIVE IMPAIRMENT SO STATED 07/13/2009  . OSTEOARTHRITIS 01/12/2008  . OVERACTIVE BLADDER 07/14/2008  . Cerebral aneurysm    Past Surgical History  Procedure Laterality Date  . Tonsillectomy    . Total knee arthroplasty    . Csf shunt      cerebral aneurysm  . Breast surgery      abcess   Family History  Problem Relation Age of Onset  . Cancer Father    History  Substance Use Topics  . Smoking status: Never Smoker   . Smokeless tobacco: Never Used  . Alcohol Use: Yes   OB History   Grav Para Term Preterm Abortions TAB SAB Ect Mult Living                 Review of Systems  Constitutional: Negative for fever and chills.  HENT: Negative for congestion and rhinorrhea.   Eyes: Negative for visual disturbance.  Respiratory: Positive for shortness of breath. Negative for cough.   Cardiovascular: Negative for chest pain and palpitations.  Gastrointestinal: Negative for nausea, vomiting and diarrhea.  Genitourinary: Negative for dysuria and  hematuria.  Musculoskeletal: Negative for back pain and gait problem.  Skin: Negative for rash.  Neurological: Negative for weakness and headaches.      Allergies  Review of patient's allergies indicates no known allergies.  Home Medications   Prior to Admission medications   Medication Sig Start Date End Date Taking? Authorizing Provider  albuterol (PROVENTIL) (2.5 MG/3ML) 0.083% nebulizer solution Take 2.5 mg by nebulization every 4 (four) hours as needed for wheezing or shortness of breath.   Yes Historical Provider, MD  aspirin 325 MG tablet Take 325 mg by mouth daily.     Yes Historical Provider, MD  Calcium Carbonate-Vitamin D (CALCIUM 600 + D PO) Take 1 tablet by mouth daily.    Yes Historical Provider, MD  clindamycin (CLEOCIN) 150 MG capsule Take 600 mg by mouth See admin instructions. Takes  1 hour prior to dental appointment   Yes Historical Provider, MD  donepezil (ARICEPT) 10 MG tablet Take 10 mg by mouth at bedtime.   Yes Historical Provider, MD  fosinopril (MONOPRIL) 20 MG tablet Take 20 mg by mouth daily.   Yes Historical Provider, MD  furosemide (LASIX) 40 MG tablet Take 40 mg by mouth daily with breakfast.    Yes Historical Provider, MD  ipratropium (ATROVENT) 0.02 % nebulizer solution Take 0.5 mg by nebulization every 4 (four) hours as  needed for wheezing or shortness of breath.   Yes Historical Provider, MD  Multiple Vitamin (MULTIVITAMIN) tablet Take 1 tablet by mouth at bedtime.    Yes Historical Provider, MD   BP 142/82  Pulse 78  Temp(Src) 98.4 F (36.9 C) (Oral)  Resp 16  SpO2 100% Physical Exam  Nursing note and vitals reviewed. Constitutional: She appears well-developed and well-nourished. No distress.  HENT:  Head: Normocephalic and atraumatic.  Mouth/Throat: Oropharynx is clear and moist.  Mild swelling of the tongue R>L. No facial or lip swelling.  Eyes: Conjunctivae and EOM are normal. Right eye exhibits no discharge. Left eye exhibits no  discharge. No scleral icterus.  Neck: Normal range of motion. Neck supple.  No neck swelling. No stridor.  Cardiovascular: Normal rate, regular rhythm and normal heart sounds.   Pulmonary/Chest: Effort normal and breath sounds normal. No respiratory distress. She has no wheezes.  Abdominal: Soft. Bowel sounds are normal. She exhibits no distension. There is no tenderness.  Musculoskeletal: She exhibits no tenderness.  Neurological: She is alert. She exhibits normal muscle tone. Coordination normal.  Skin: Skin is warm and dry. She is not diaphoretic.  Psychiatric: She has a normal mood and affect. Her behavior is normal.    ED Course  Procedures (including critical care time) Labs Review Labs Reviewed  CBC  BASIC METABOLIC PANEL  URINALYSIS, ROUTINE W REFLEX MICROSCOPIC    Imaging Review No results found.   EKG Interpretation None     Meds given in ED:  Medications - No data to display  New Prescriptions   No medications on file      MDM   Final diagnoses:  Angioedema, initial encounter   Patient is a 80 year old female who has taken lisinopril for years and woke up this morning with tongue swelling. She reports difficulty swallowing but no difficulty breathing. No facial or lip swelling. Patient most likely with angioedema from lisinopril. Discussed that there is no treatment other than observation. Patient agreable to this. W  10:00 patient reports decreased tongue swelling but still difficulty swallowing. No difficulty breathing. Tongue with decreased swelling. Will admit patient to the hospital for observation.   Discussed return precautions with patient. Discussed all results and patient verbalizes understanding and agrees with plan.  This is a shared patient. This patient was discussed with the physician, Dr. Lorre Nick who saw and evaluated the patient.     Louann Sjogren, PA-C 05/04/14 [phone]

## 2014-05-05 DIAGNOSIS — T783XXA Angioneurotic edema, initial encounter: Secondary | ICD-10-CM | POA: Diagnosis present

## 2014-05-05 DIAGNOSIS — Z7982 Long term (current) use of aspirin: Secondary | ICD-10-CM | POA: Diagnosis not present

## 2014-05-05 DIAGNOSIS — T46905A Adverse effect of unspecified agents primarily affecting the cardiovascular system, initial encounter: Secondary | ICD-10-CM | POA: Diagnosis present

## 2014-05-05 DIAGNOSIS — I10 Essential (primary) hypertension: Secondary | ICD-10-CM | POA: Diagnosis present

## 2014-05-05 DIAGNOSIS — F0390 Unspecified dementia without behavioral disturbance: Secondary | ICD-10-CM | POA: Diagnosis present

## 2014-05-05 DIAGNOSIS — M1990 Unspecified osteoarthritis, unspecified site: Secondary | ICD-10-CM | POA: Diagnosis present

## 2014-05-05 DIAGNOSIS — Z96659 Presence of unspecified artificial knee joint: Secondary | ICD-10-CM | POA: Diagnosis not present

## 2014-05-05 DIAGNOSIS — Y92009 Unspecified place in unspecified non-institutional (private) residence as the place of occurrence of the external cause: Secondary | ICD-10-CM | POA: Diagnosis not present

## 2014-05-05 LAB — COMPREHENSIVE METABOLIC PANEL
ALK PHOS: 91 U/L (ref 39–117)
ALT: 16 U/L (ref 0–35)
AST: 14 U/L (ref 0–37)
Albumin: 3.4 g/dL — ABNORMAL LOW (ref 3.5–5.2)
Anion gap: 12 (ref 5–15)
BUN: 20 mg/dL (ref 6–23)
CO2: 24 meq/L (ref 19–32)
Calcium: 9 mg/dL (ref 8.4–10.5)
Chloride: 99 mEq/L (ref 96–112)
Creatinine, Ser: 0.8 mg/dL (ref 0.50–1.10)
GFR calc non Af Amer: 66 mL/min — ABNORMAL LOW (ref >90)
GFR, EST AFRICAN AMERICAN: 76 mL/min — AB (ref >90)
Glucose, Bld: 135 mg/dL — ABNORMAL HIGH (ref 70–99)
POTASSIUM: 3.7 meq/L (ref 3.7–5.3)
SODIUM: 135 meq/L — AB (ref 137–147)
Total Bilirubin: 0.5 mg/dL (ref 0.3–1.2)
Total Protein: 6.6 g/dL (ref 6.0–8.3)

## 2014-05-05 LAB — CBC
HCT: 41.5 % (ref 36.0–46.0)
HEMOGLOBIN: 13.4 g/dL (ref 12.0–15.0)
MCH: 30 pg (ref 26.0–34.0)
MCHC: 32.3 g/dL (ref 30.0–36.0)
MCV: 93 fL (ref 78.0–100.0)
Platelets: 238 10*3/uL (ref 150–400)
RBC: 4.46 MIL/uL (ref 3.87–5.11)
RDW: 14.3 % (ref 11.5–15.5)
WBC: 6 10*3/uL (ref 4.0–10.5)

## 2014-05-05 LAB — GLUCOSE, CAPILLARY: GLUCOSE-CAPILLARY: 115 mg/dL — AB (ref 70–99)

## 2014-05-05 NOTE — Progress Notes (Signed)
Patient discharged to home with husband, discharge instructions reviewed with patient and husband who verbalized understanding. No new RX's given.

## 2014-05-05 NOTE — Discharge Instructions (Signed)
Angioedema °Angioedema is a sudden swelling of tissues, often of the skin. It can occur on the face or genitals or in the abdomen or other body parts. The swelling usually develops over a short period and gets better in 24 to 48 hours. It often begins during the night and is found when the person wakes up. The person may also get red, itchy patches of skin (hives). Angioedema can be dangerous if it involves swelling of the air passages.  °Depending on the cause, episodes of angioedema may only happen once, come back in unpredictable patterns, or repeat for several years and then gradually fade away.  °CAUSES  °Angioedema can be caused by an allergic reaction to various triggers. It can also result from nonallergic causes, including reactions to drugs, immune system disorders, viral infections, or an abnormal gene that is passed to you from your parents (hereditary). For some people with angioedema, the cause is unknown.  °Some things that can trigger angioedema include:  °· Foods.   °· Medicines, such as ACE inhibitors, ARBs, nonsteroidal anti-inflammatory agents, or estrogen.   °· Latex.   °· Animal saliva.   °· Insect stings.   °· Dyes used in X-rays.   °· Mild injury.   °· Dental work. °· Surgery. °· Stress.   °· Sudden changes in temperature.   °· Exercise. °SIGNS AND SYMPTOMS  °· Swelling of the skin. °· Hives. If these are present, there is also intense itching. °· Redness in the affected area.   °· Pain in the affected area. °· Swollen lips or tongue. °· Breathing problems. This may happen if the air passages swell. °· Wheezing. °If internal organs are involved, there may be:  °· Nausea.   °· Abdominal pain.   °· Vomiting.   °· Difficulty swallowing.   °· Difficulty passing urine. °DIAGNOSIS  °· Your health care provider will examine the affected area and take a medical and family history. °· Various tests may be done to help determine the cause. Tests may include: °¨ Allergy skin tests to see if the problem  is an allergic reaction.   °¨ Blood tests to check for hereditary angioedema.   °¨ Tests to check for underlying diseases that could cause the condition.   °· A review of your medicines, including over-the-counter medicines, may be done. °TREATMENT  °Treatment will depend on the cause of the angioedema. Possible treatments include:  °· Removal of anything that triggered the condition (such as stopping certain medicines).   °· Medicines to treat symptoms or prevent attacks. Medicines given may include:   °¨ Antihistamines.   °¨ Epinephrine injection.   °¨ Steroids.   °· Hospitalization may be required for severe attacks. If the air passages are affected, it can be an emergency. Tubes may need to be placed to keep the airway open. °HOME CARE INSTRUCTIONS  °· Take all medicines as directed by your health care provider. °· If you were given medicines for emergency allergy treatment, always carry them with you. °· Wear a medical bracelet as directed by your health care provider.   °· Avoid known triggers. °SEEK MEDICAL CARE IF:  °· You have repeat attacks of angioedema.   °· Your attacks are more frequent or more severe despite preventive measures.   °· You have hereditary angioedema and are considering having children. It is important to discuss with your health care provider the risks of passing the condition on to your children. °SEEK IMMEDIATE MEDICAL CARE IF:  °· You have severe swelling of the mouth, tongue, or lips. °· You have difficulty breathing.   °· You have difficulty swallowing.   °· You faint. °MAKE   SURE YOU: °· Understand these instructions. °· Will watch your condition. °· Will get help right away if you are not doing well or get worse. °Document Released: 11/04/2001 Document Revised: 01/10/2014 Document Reviewed: 04/19/2013 °[employer]® Patient Information ©2015 [employer]. This information is not intended to replace advice given to you by your health care provider. Make sure you discuss any questions  you have with your health care provider. ° °

## 2014-05-05 NOTE — Discharge Summary (Signed)
Physician Discharge Summary  Susan Landry ZOX:[id number] DOB: [birth year] DOA: 05/04/2014  PCP: Ginette Otto, MD  Admit date: 05/04/2014 Discharge date: 05/05/2014  Recommendations for Outpatient Follow-up:  Stop fosinopril because you had an allergic reaction to it.  Please let your PCP know about ace inhibitor allergy.  Discharge Diagnoses:  Active Problems:   Angioedema    Discharge Condition: stable   Diet recommendation: as tolerated   History of present illness:  80 year old female with past medical history hypertension, dementia who presented to [facility] ED 05/04/2014 with reports of sudden onset of tonsil swelling started this morning prior to this admission. Patient reported some difficulty swallowing but there was no respiratory distress. No other complaints of facial swelling or lip swelling. No hives. Patient reported previous reactions which would involve her lips. No complaints of chest pain, shortness of breath or palpitations. No abdominal pain, nausea or vomiting.   In ED, vital signs are stable. Blood work is unremarkable. Patient will be started on steroids, Pepcid and Benadryl. Fosinopril stopped.   Assessment & Plan   Active Problems:  Angioedema  ACE inhibitor allergy, fosinopril stopped  Patient was given Solu-Medrol 125 mg once on the admission which was continued by 60 mg IV daily. She was also given Pepcid IV 20 mg and Benadryl as needed. Tongue swelling swelling is completely resolved. She does not require prednisone on discharge.  Hypertension, essential  Hold fosinopril but may continue Lasix. DVT prophylaxis:  SCDs bilaterally while patient is in a hospital.   Radiological Exams on Admission:  No results found.     SignedManson Passey, MD  [facility] 05/05/2014, 9:41 AM  Pager #: [phone]   Discharge Exam: Filed Vitals:   05/05/14 0520  BP: 128/86  Pulse: 50  Temp: 98 F (36.7 C)  Resp: 16   Filed Vitals:    05/04/14 1150 05/04/14 1308 05/04/14 2115 05/05/14 0520  BP: 138/73 154/81 119/78 128/86  Pulse: 77 71 69 50  Temp: 98.3 F (36.8 C) 98 F (36.7 C) 98.3 F (36.8 C) 98 F (36.7 C)  TempSrc: Oral Oral Oral Oral  Resp: Height:   (1.549 m)    Weight:  78.518 kg (173 lb 1.6 oz)  53.9 kg (118 lb 13.3 oz)  SpO2: 94% 97% 93% 94%    General: Pt is alert, follows commands appropriately, not in acute distress Cardiovascular: Regular rate and rhythm, S1/S2 +, no murmurs Respiratory: Clear to auscultation bilaterally, no wheezing, no crackles, no rhonchi Abdominal: Soft, non tender, non distended, bowel sounds +, no guarding Extremities: no edema, no cyanosis, pulses palpable bilaterally DP and PT Neuro: Grossly nonfocal  Discharge Instructions  Discharge Instructions   Call MD for:  difficulty breathing, headache or visual disturbances    Complete by:  As directed      Call MD for:  persistant dizziness or light-headedness    Complete by:  As directed      Call MD for:  persistant nausea and vomiting    Complete by:  As directed      Call MD for:  severe uncontrolled pain    Complete by:  As directed      Diet - low sodium heart healthy    Complete by:  As directed      Discharge instructions    Complete by:  As directed   Stop fosinopril because you had an allergic reaction to it.  Please let your PCP know  about ace inhibitor allergy.     Increase activity slowly    Complete by:  As directed             Medication List    STOP taking these medications       fosinopril 20 MG tablet  Commonly known as:  MONOPRIL      TAKE these medications       albuterol (2.5 MG/3ML) 0.083% nebulizer solution  Commonly known as:  PROVENTIL  Take 2.5 mg by nebulization every 4 (four) hours as needed for wheezing or shortness of breath.     aspirin 325 MG tablet  Take 325 mg by mouth daily.     CALCIUM 600 + D PO  Take 1 tablet by mouth daily.     clindamycin 150  MG capsule  Commonly known as:  CLEOCIN  Take 600 mg by mouth See admin instructions. Takes  1 hour prior to dental appointment     donepezil 10 MG tablet  Commonly known as:  ARICEPT  Take 10 mg by mouth at bedtime.     furosemide 40 MG tablet  Commonly known as:  LASIX  Take 40 mg by mouth daily with breakfast.     ipratropium 0.02 % nebulizer solution  Commonly known as:  ATROVENT  Take 0.5 mg by nebulization every 4 (four) hours as needed for wheezing or shortness of breath.     multivitamin tablet  Take 1 tablet by mouth at bedtime.           Follow-up Information   Follow up with Ginette Otto, MD. (angioedema due to fosinopril )    Specialty:  Internal Medicine   Contact information:   [address] [place] [address] [phone]        The results of significant diagnostics from this hospitalization (including imaging, microbiology, ancillary and laboratory) are listed below for reference.    Significant Diagnostic Studies: No results found.  Microbiology: No results found for this or any previous visit (from the past 240 hour(s)).   Labs: Basic Metabolic Panel:  Recent Labs Lab 05/04/14 0951 05/05/14 0516  NA 140 135*  K 4.1 3.7  CL 99 99  CO2 28 24  GLUCOSE 123* 135*  BUN 25* 20  CREATININE 0.85 0.80  CALCIUM 9.4 9.0   Liver Function Tests:  Recent Labs Lab 05/05/14 0516  AST 14  ALT 16  ALKPHOS 91  BILITOT 0.5  PROT 6.6  ALBUMIN 3.4*   No results found for this basename: LIPASE, AMYLASE,  in the last 168 hours No results found for this basename: AMMONIA,  in the last 168 hours CBC:  Recent Labs Lab 05/04/14 0951 05/05/14 0516  WBC 7.2 6.0  HGB 14.2 13.4  HCT 42.0 41.5  MCV 90.9 93.0  PLT 251 238   Cardiac Enzymes: No results found for this basename: CKTOTAL, CKMB, CKMBINDEX, TROPONINI,  in the last 168 hours BNP: BNP (last 3 results)  Recent Labs  10/03/13 1920  PROBNP 55.4    CBG:  Recent Labs Lab 05/05/14 0723  GLUCAP 115*    Time coordinating discharge: Over 30 minutes

## 2014-05-06 NOTE — ED Provider Notes (Signed)
Medical screening examination/treatment/procedure(s) were conducted as a shared visit with non-physician practitioner(s) and myself.  I personally evaluated the patient during the encounter.   EKG Interpretation None       Toy Baker, MD 05/06/14 [phone]

## 2014-09-27 ENCOUNTER — Ambulatory Visit
Admission: RE | Admit: 2014-09-27 | Discharge: 2014-09-27 | Disposition: A | Discharge status: Home / Self Care | Payer: Medicare Other | Source: Ambulatory Visit | Attending: Internal Medicine | Admitting: Internal Medicine

## 2014-09-27 ENCOUNTER — Other Ambulatory Visit: Payer: Self-pay | Admitting: Internal Medicine

## 2014-09-27 DIAGNOSIS — R509 Fever, unspecified: Secondary | ICD-10-CM

## 2015-03-06 ENCOUNTER — Other Ambulatory Visit: Payer: Self-pay

## 2019-04-21 ENCOUNTER — Encounter: Payer: Self-pay | Admitting: Podiatry

## 2019-04-21 ENCOUNTER — Other Ambulatory Visit: Payer: Self-pay

## 2019-04-21 ENCOUNTER — Ambulatory Visit: Payer: Medicare Other | Admitting: Podiatry

## 2019-04-21 DIAGNOSIS — E1140 Type 2 diabetes mellitus with diabetic neuropathy, unspecified: Secondary | ICD-10-CM | POA: Insufficient documentation

## 2019-04-21 DIAGNOSIS — M79675 Pain in left toe(s): Secondary | ICD-10-CM | POA: Diagnosis not present

## 2019-04-21 DIAGNOSIS — B351 Tinea unguium: Secondary | ICD-10-CM | POA: Diagnosis not present

## 2019-04-21 DIAGNOSIS — E1142 Type 2 diabetes mellitus with diabetic polyneuropathy: Secondary | ICD-10-CM

## 2019-04-21 DIAGNOSIS — M79674 Pain in right toe(s): Secondary | ICD-10-CM | POA: Diagnosis not present

## 2019-04-21 NOTE — Progress Notes (Signed)
Complaint:  Visit Type: Patient presents  to my office for  preventative foot care services. Complaint: Patient states" my nails have grown long and thick and become painful to walk and wear shoes".  Patient presents to the office with her daughter.  She says her mother was previously treated at a salon.    The patient presents for preventative foot care services. No changes to ROS  Podiatric Exam: Vascular: dorsalis pedis and posterior tibial pulses are palpable bilateral. Capillary return is immediate. Temperature gradient is WNL. Skin turgor WNL  Sensorium: Diminished/Absent  Semmes Weinstein monofilament test. Diminished  tactile sensation bilaterally. Nail Exam: Pt has thick disfigured discolored nails with subungual debris noted bilateral entire nail hallux through fifth toenails Ulcer Exam: There is no evidence of ulcer or pre-ulcerative changes or infection. Orthopedic Exam: Muscle tone and strength are WNL. No limitations in general ROM. No crepitus or effusions noted. Foot type and digits show no abnormalities. Bony prominences are unremarkable. Skin: No Porokeratosis. No infection or ulcers  Diagnosis:  Onychomycosis, , Pain in right toe, pain in left toes  Treatment & Plan Procedures and Treatment: Consent by patient was obtained for treatment procedures.   Debridement of mycotic and hypertrophic toenails, 1 through 5 bilateral and clearing of subungual debris. No ulceration, no infection noted.  Return Visit-Office Procedure: Patient instructed to return to the office for a follow up visit 3 months for continued evaluation and treatment.    Gardiner Barefoot DPM

## 2019-06-29 ENCOUNTER — Emergency Department (HOSPITAL_COMMUNITY): Payer: Medicare Other

## 2019-06-29 ENCOUNTER — Inpatient Hospital Stay (HOSPITAL_COMMUNITY)
Admission: EM | Admit: 2019-06-29 | Discharge: 2019-07-06 | DRG: 189 | Disposition: A | Discharge status: Skilled Nursing Facility | Payer: Medicare Other | Attending: Internal Medicine | Admitting: Internal Medicine

## 2019-06-29 ENCOUNTER — Other Ambulatory Visit: Payer: Self-pay

## 2019-06-29 ENCOUNTER — Encounter (HOSPITAL_COMMUNITY): Payer: Self-pay | Admitting: *Deleted

## 2019-06-29 DIAGNOSIS — J9601 Acute respiratory failure with hypoxia: Principal | ICD-10-CM | POA: Diagnosis present

## 2019-06-29 DIAGNOSIS — I10 Essential (primary) hypertension: Secondary | ICD-10-CM | POA: Diagnosis present

## 2019-06-29 DIAGNOSIS — I878 Other specified disorders of veins: Secondary | ICD-10-CM | POA: Diagnosis present

## 2019-06-29 DIAGNOSIS — J9691 Respiratory failure, unspecified with hypoxia: Secondary | ICD-10-CM | POA: Diagnosis not present

## 2019-06-29 DIAGNOSIS — Z79899 Other long term (current) drug therapy: Secondary | ICD-10-CM

## 2019-06-29 DIAGNOSIS — J9801 Acute bronchospasm: Secondary | ICD-10-CM | POA: Diagnosis not present

## 2019-06-29 DIAGNOSIS — Z7982 Long term (current) use of aspirin: Secondary | ICD-10-CM

## 2019-06-29 DIAGNOSIS — R944 Abnormal results of kidney function studies: Secondary | ICD-10-CM | POA: Diagnosis present

## 2019-06-29 DIAGNOSIS — R05 Cough: Secondary | ICD-10-CM | POA: Diagnosis present

## 2019-06-29 DIAGNOSIS — F0390 Unspecified dementia without behavioral disturbance: Secondary | ICD-10-CM | POA: Diagnosis present

## 2019-06-29 DIAGNOSIS — I671 Cerebral aneurysm, nonruptured: Secondary | ICD-10-CM | POA: Diagnosis present

## 2019-06-29 DIAGNOSIS — Z9981 Dependence on supplemental oxygen: Secondary | ICD-10-CM

## 2019-06-29 DIAGNOSIS — J441 Chronic obstructive pulmonary disease with (acute) exacerbation: Secondary | ICD-10-CM

## 2019-06-29 DIAGNOSIS — R0902 Hypoxemia: Secondary | ICD-10-CM | POA: Diagnosis not present

## 2019-06-29 DIAGNOSIS — M1990 Unspecified osteoarthritis, unspecified site: Secondary | ICD-10-CM | POA: Diagnosis present

## 2019-06-29 DIAGNOSIS — D751 Secondary polycythemia: Secondary | ICD-10-CM | POA: Diagnosis present

## 2019-06-29 DIAGNOSIS — B351 Tinea unguium: Secondary | ICD-10-CM | POA: Diagnosis present

## 2019-06-29 DIAGNOSIS — Z20828 Contact with and (suspected) exposure to other viral communicable diseases: Secondary | ICD-10-CM | POA: Diagnosis present

## 2019-06-29 DIAGNOSIS — M7989 Other specified soft tissue disorders: Secondary | ICD-10-CM | POA: Diagnosis present

## 2019-06-29 DIAGNOSIS — G3184 Mild cognitive impairment, so stated: Secondary | ICD-10-CM | POA: Diagnosis present

## 2019-06-29 DIAGNOSIS — Z7722 Contact with and (suspected) exposure to environmental tobacco smoke (acute) (chronic): Secondary | ICD-10-CM | POA: Diagnosis present

## 2019-06-29 DIAGNOSIS — Z96659 Presence of unspecified artificial knee joint: Secondary | ICD-10-CM | POA: Diagnosis present

## 2019-06-29 DIAGNOSIS — Z66 Do not resuscitate: Secondary | ICD-10-CM | POA: Diagnosis present

## 2019-06-29 DIAGNOSIS — N3281 Overactive bladder: Secondary | ICD-10-CM | POA: Diagnosis present

## 2019-06-29 DIAGNOSIS — R451 Restlessness and agitation: Secondary | ICD-10-CM | POA: Diagnosis not present

## 2019-06-29 LAB — CBC WITH DIFFERENTIAL/PLATELET
Abs Immature Granulocytes: 0.02 10*3/uL (ref 0.00–0.07)
Basophils Absolute: 0.1 10*3/uL (ref 0.0–0.1)
Basophils Relative: 1 %
Eosinophils Absolute: 0.3 10*3/uL (ref 0.0–0.5)
Eosinophils Relative: 4 %
HCT: 48.4 % — ABNORMAL HIGH (ref 36.0–46.0)
Hemoglobin: 15.3 g/dL — ABNORMAL HIGH (ref 12.0–15.0)
Immature Granulocytes: 0 %
Lymphocytes Relative: 14 %
Lymphs Abs: 1 10*3/uL (ref 0.7–4.0)
MCH: 29.9 pg (ref 26.0–34.0)
MCHC: 31.6 g/dL (ref 30.0–36.0)
MCV: 94.7 fL (ref 80.0–100.0)
Monocytes Absolute: 0.7 10*3/uL (ref 0.1–1.0)
Monocytes Relative: 9 %
Neutro Abs: 5.1 10*3/uL (ref 1.7–7.7)
Neutrophils Relative %: 72 %
Platelets: 226 10*3/uL (ref 150–400)
RBC: 5.11 MIL/uL (ref 3.87–5.11)
RDW: 14.8 % (ref 11.5–15.5)
WBC: 7.1 10*3/uL (ref 4.0–10.5)
nRBC: 0 % (ref 0.0–0.2)

## 2019-06-29 LAB — TROPONIN I (HIGH SENSITIVITY): Troponin I (High Sensitivity): 3 ng/L (ref <18)

## 2019-06-29 LAB — COMPREHENSIVE METABOLIC PANEL
ALT: 20 U/L (ref 0–44)
AST: 18 U/L (ref 15–41)
Albumin: 3.8 g/dL (ref 3.5–5.0)
Alkaline Phosphatase: 106 U/L (ref 38–126)
Anion gap: 9 (ref 5–15)
BUN: 25 mg/dL — ABNORMAL HIGH (ref 8–23)
CO2: 36 mmol/L — ABNORMAL HIGH (ref 22–32)
Calcium: 8.9 mg/dL (ref 8.9–10.3)
Chloride: 97 mmol/L — ABNORMAL LOW (ref 98–111)
Creatinine, Ser: 0.87 mg/dL (ref 0.44–1.00)
GFR calc Af Amer: >60 mL/min (ref >60)
GFR calc non Af Amer: 59 mL/min — ABNORMAL LOW (ref >60)
Glucose, Bld: 91 mg/dL (ref 70–99)
Potassium: 3.9 mmol/L (ref 3.5–5.1)
Sodium: 142 mmol/L (ref 135–145)
Total Bilirubin: 0.8 mg/dL (ref 0.3–1.2)
Total Protein: 7 g/dL (ref 6.5–8.1)

## 2019-06-29 LAB — PROTIME-INR
INR: 1 (ref 0.8–1.2)
Prothrombin Time: 12.6 seconds (ref 11.4–15.2)

## 2019-06-29 LAB — SARS CORONAVIRUS 2 (TAT 6-24 HRS): SARS Coronavirus 2: NEGATIVE

## 2019-06-29 LAB — BRAIN NATRIURETIC PEPTIDE: B Natriuretic Peptide: 74 pg/mL (ref 0.0–100.0)

## 2019-06-29 MED ORDER — HYDROCODONE-ACETAMINOPHEN 5-325 MG PO TABS: 1.0000 | ORAL_TABLET | ORAL | Status: DC | PRN

## 2019-06-29 MED ORDER — ONDANSETRON HCL 4 MG/2ML IJ SOLN: 4.0000 mg | Freq: Four times a day (QID) | INTRAMUSCULAR | Status: DC | PRN

## 2019-06-29 MED ORDER — FUROSEMIDE 40 MG PO TABS
40.0000 mg | ORAL_TABLET | Freq: Two times a day (BID) | ORAL | Status: DC
  Administered 2019-06-30 – 2019-07-06 (×13): 40 mg via ORAL
  Filled 2019-06-29 (×13): qty 1

## 2019-06-29 MED ORDER — ENOXAPARIN SODIUM 40 MG/0.4ML ~~LOC~~ SOLN
40.0000 mg | Freq: Every day | SUBCUTANEOUS | Status: DC
  Administered 2019-06-30 – 2019-07-05 (×7): 40 mg via SUBCUTANEOUS
  Filled 2019-06-29 (×7): qty 0.4

## 2019-06-29 MED ORDER — POLYETHYLENE GLYCOL 3350 17 G PO PACK: 17.0000 g | PACK | Freq: Every day | ORAL | Status: DC | PRN

## 2019-06-29 MED ORDER — ONDANSETRON HCL 4 MG PO TABS: 4.0000 mg | ORAL_TABLET | Freq: Four times a day (QID) | ORAL | Status: DC | PRN

## 2019-06-29 MED ORDER — SODIUM CHLORIDE 0.9% FLUSH: 3.0000 mL | INTRAVENOUS | Status: DC | PRN

## 2019-06-29 MED ORDER — SODIUM CHLORIDE 0.9 % IV SOLN: 250.0000 mL | INTRAVENOUS | Status: DC | PRN

## 2019-06-29 MED ORDER — ACETAMINOPHEN 325 MG PO TABS: 650.0000 mg | ORAL_TABLET | Freq: Four times a day (QID) | ORAL | Status: DC | PRN

## 2019-06-29 MED ORDER — SODIUM CHLORIDE 0.9% FLUSH
3.0000 mL | Freq: Two times a day (BID) | INTRAVENOUS | Status: DC
  Administered 2019-06-30 – 2019-07-06 (×13): 3 mL via INTRAVENOUS

## 2019-06-29 MED ORDER — DONEPEZIL HCL 10 MG PO TABS
10.0000 mg | ORAL_TABLET | Freq: Every day | ORAL | Status: DC
  Administered 2019-06-30 – 2019-07-05 (×7): 10 mg via ORAL
  Filled 2019-06-29 (×7): qty 1

## 2019-06-29 MED ORDER — ACETAMINOPHEN 650 MG RE SUPP: 650.0000 mg | Freq: Four times a day (QID) | RECTAL | Status: DC | PRN

## 2019-06-29 MED ORDER — METHYLPREDNISOLONE SODIUM SUCC 125 MG IJ SOLR
125.0000 mg | Freq: Once | INTRAMUSCULAR | Status: AC
  Administered 2019-06-29: 20:00:00 125 mg via INTRAVENOUS
  Filled 2019-06-29: qty 2

## 2019-06-29 MED ORDER — METHYLPREDNISOLONE SODIUM SUCC 125 MG IJ SOLR
60.0000 mg | Freq: Four times a day (QID) | INTRAMUSCULAR | Status: AC
  Administered 2019-06-30 (×4): 60 mg via INTRAVENOUS
  Filled 2019-06-29 (×4): qty 2

## 2019-06-29 MED ORDER — POTASSIUM CHLORIDE ER 8 MEQ PO TBCR
8.0000 meq | EXTENDED_RELEASE_TABLET | Freq: Every day | ORAL | Status: DC
  Filled 2019-06-29: qty 1

## 2019-06-29 MED ORDER — IOHEXOL 350 MG/ML SOLN
100.0000 mL | Freq: Once | INTRAVENOUS | Status: AC | PRN
  Administered 2019-06-29: 16:00:00 100 mL via INTRAVENOUS

## 2019-06-29 MED ORDER — IPRATROPIUM-ALBUTEROL 0.5-2.5 (3) MG/3ML IN SOLN: 3.0000 mL | RESPIRATORY_TRACT | Status: DC | PRN

## 2019-06-29 MED ORDER — ALBUTEROL (5 MG/ML) CONTINUOUS INHALATION SOLN
10.0000 mg/h | INHALATION_SOLUTION | Freq: Once | RESPIRATORY_TRACT | Status: AC
  Administered 2019-06-29: 20:00:00 10 mg/h via RESPIRATORY_TRACT
  Filled 2019-06-29: qty 20

## 2019-06-29 NOTE — ED Notes (Signed)
Attempted to get patient up to ambulate with walker and RA oxygen saturation read 84-88%. Dr. Eulis Foster called to bedside to evaluate patient. Decision made to admit patient at this time.

## 2019-06-29 NOTE — ED Notes (Signed)
Patient transported to CT 

## 2019-06-29 NOTE — ED Notes (Signed)
Respiratory at bedside.

## 2019-06-29 NOTE — ED Notes (Signed)
ED TO INPATIENT HANDOFF REPORT  Name/Age/Gender Susan Landry 85 y.o. female  Code Status Code Status History    Date Active Date Inactive Code Status Order ID Comments User Context   05/04/2014 1249 05/05/2014 1324 Full Code [id number]  Alison Murray, MD ED   10/04/2013 0033 10/09/2013 1545 DNR [id number]  Alysia Penna, MD Inpatient   Advance Care Planning Activity      Home/SNF/Other Home  Chief Complaint cough / abnormal chest xray   Level of Care/Admitting Diagnosis ED Disposition    ED Disposition Condition Comment   Admit  Hospital Area: [facility] [[id number]]  Level of Care: Med-Surg [16]  Covid Evaluation: Confirmed COVID Negative  Diagnosis: Respiratory failure with hypoxia [facility]) [[id number]]  Admitting Physician: Briscoe Deutscher [[id number]]  Attending Physician: Briscoe Deutscher [[id number]]  PT Class (Do Not Modify): Observation [104]  PT Acc Code (Do Not Modify): Observation [10022]       Medical History Past Medical History:  Diagnosis Date  . Cerebral aneurysm   . HYPERTENSION 07/14/2007  . MILD COGNITIVE IMPAIRMENT SO STATED 07/13/2009  . OSTEOARTHRITIS 01/12/2008  . OVERACTIVE BLADDER 07/14/2008    Allergies No Known Allergies  IV Location/Drains/Wounds Patient Lines/Drains/Airways Status   Active Line/Drains/Airways    Name:   Placement date:   Placement time:   Site:   Days:   Peripheral IV 06/29/19 Right Antecubital   06/29/19    1356    Antecubital   less than 1          Labs/Imaging Results for orders placed or performed during the hospital encounter of 06/29/19 (from the past 48 hour(s))  Comprehensive metabolic panel     Status: Abnormal   Collection Time: 06/29/19 11:38 AM  Result Value Ref Range   Sodium 142 135 - 145 mmol/L   Potassium 3.9 3.5 - 5.1 mmol/L   Chloride 97 (L) 98 - 111 mmol/L   CO2 36 (H) 22 - 32 mmol/L   Glucose, Bld 91 70 - 99 mg/dL   BUN 25 (H) 8 - 23 mg/dL   Creatinine, Ser 5.17 0.44 - 1.00 mg/dL   Calcium 8.9 8.9 - 00.1 mg/dL   Total Protein 7.0 6.5 - 8.1 g/dL   Albumin 3.8 3.5 - 5.0 g/dL   AST 18 15 - 41 U/L   ALT 20 0 - 44 U/L   Alkaline Phosphatase 106 38 - 126 U/L   Total Bilirubin 0.8 0.3 - 1.2 mg/dL   GFR calc non Af Amer 59 (L) >60 mL/min   GFR calc Af Amer >60 >60 mL/min   Anion gap 9 5 - 15    Comment: Performed at [facility], [address] [place], [place] [address]  Brain natriuretic peptide     Status: None   Collection Time: 06/29/19 11:38 AM  Result Value Ref Range   B Natriuretic Peptide 74.0 0.0 - 100.0 pg/mL    Comment: Performed at [facility], [address], [place], [place] [address]  Troponin I (High Sensitivity)     Status: None   Collection Time: 06/29/19 11:38 AM  Result Value Ref Range   Troponin I (High Sensitivity) 3 <18 ng/L    Comment: (NOTE) Elevated high sensitivity troponin I (hsTnI) values and significant  changes across serial measurements may suggest ACS but many other  chronic and acute conditions are known to elevate hsTnI results.  Refer to the "Links" section for chest pain algorithms and additional  guidance. Performed at [facility], [address] [place], [place] [address]   CBC with Differential     Status: Abnormal   Collection Time: 06/29/19 11:38 AM  Result Value Ref Range   WBC 7.1 4.0 - 10.5 K/uL   RBC 5.11 3.87 - 5.11 MIL/uL   Hemoglobin 15.3 (H) 12.0 - 15.0 g/dL   HCT 60.448.4 (H) 54.036.0 - 98.146.0 %   MCV 94.7 80.0 - 100.0 fL   MCH 29.9 26.0 - 34.0 pg   MCHC 31.6 30.0 - 36.0 g/dL   RDW 19.114.8 47.811.5 - 29.515.5 %   Platelets 226 150 - 400 K/uL   nRBC 0.0 0.0 - 0.2 %   Neutrophils Relative % 72 %   Neutro Abs 5.1 1.7 - 7.7 K/uL   Lymphocytes Relative 14 %   Lymphs Abs 1.0 0.7 - 4.0 K/uL   Monocytes Relative 9 %   Monocytes Absolute 0.7 0.1 - 1.0 K/uL   Eosinophils Relative 4 %   Eosinophils Absolute 0.3 0.0 - 0.5 K/uL   Basophils Relative 1 %   Basophils Absolute  0.1 0.0 - 0.1 K/uL   Immature Granulocytes 0 %   Abs Immature Granulocytes 0.02 0.00 - 0.07 K/uL    Comment: Performed at [facility], [address] [place], [place] [address]  Protime-INR     Status: None   Collection Time: 06/29/19 11:38 AM  Result Value Ref Range   Prothrombin Time 12.6 11.4 - 15.2 seconds   INR 1.0 0.8 - 1.2    Comment: (NOTE) INR goal varies based on device and disease states. Performed at [facility], [address] [place], [place] [address]   SARS CORONAVIRUS 2 (TAT 6-24 HRS) Nasopharyngeal Nasopharyngeal Swab     Status: None   Collection Time: 06/29/19 11:38 AM   Specimen: Nasopharyngeal Swab  Result Value Ref Range   SARS Coronavirus 2 NEGATIVE NEGATIVE    Comment: (NOTE) SARS-CoV-2 target nucleic acids are NOT DETECTED. The SARS-CoV-2 RNA is generally detectable in upper and lower respiratory specimens during the acute phase of infection. Negative results do not preclude SARS-CoV-2 infection, do not rule out co-infections with other pathogens, and should not be used as the sole basis for treatment or other patient management decisions. Negative results must be combined with clinical observations, patient history, and epidemiological information. The expected result is Negative. Fact Sheet for Patients: HairSlick.nohttps://www.fda.gov/media/138098/download Fact Sheet for Healthcare Providers: quierodirigir.comhttps://www.fda.gov/media/138095/download This test is not yet approved or cleared by the [place] FDA and  has been authorized for detection and/or diagnosis of SARS-CoV-2 by FDA under an Emergency Use Authorization (EUA). This EUA will remain  in effect (meaning this test can be used) for the duration of the COVID-19 declaration under Section 56 4(b)(1) of the Act, 21 U.S.C. section 360bbb-3(b)(1), unless the authorization is terminated or revoked sooner. Performed at [facility], [address] [place],  [place] [address]    Dg Chest 2 View  Result Date: 06/29/2019 CLINICAL DATA:  Persistent elevation of the right hemidiaphragm. Stable hiatal hernia. No acute abnormality noted. EXAM: CHEST - 2 VIEW COMPARISON:  09/27/2014 FINDINGS: Cardiac shadow is at the upper limits of normal but stable. Aortic calcifications are seen. Elevation of the right hemidiaphragm is noted. Hiatal hernia is better visualized on today's exam. No focal infiltrate or sizable effusion is seen. Shunt catheter is noted along the right anterior chest wall. IMPRESSION: Chronic changes as described.  No acute  abnormality noted. Electronically Signed   By: Inez Catalina M.D.   On: 06/29/2019 11:05   Ct Angio Chest Pe W/cm &/or Wo Cm  Result Date: 06/29/2019 CLINICAL DATA:  Chest pain. EXAM: CT ANGIOGRAPHY CHEST WITH CONTRAST TECHNIQUE: Multidetector CT imaging of the chest was performed using the standard protocol during bolus administration of intravenous contrast. Multiplanar CT image reconstructions and MIPs were obtained to evaluate the vascular anatomy. CONTRAST:  172mL OMNIPAQUE IOHEXOL 350 MG/ML SOLN COMPARISON:  None. FINDINGS: Cardiovascular: Satisfactory opacification of the pulmonary arteries to the segmental level. No evidence of pulmonary embolism. Normal heart size. No pericardial effusion. Coronary artery calcifications are noted. Atherosclerosis of thoracic aorta is noted. Mediastinum/Nodes: Large sliding-type hiatal hernia is noted. No adenopathy is noted. Thyroid gland is unremarkable. Lungs/Pleura: No pneumothorax or pleural effusion is noted. Elevated right hemidiaphragm is noted. Minimal bibasilar subsegmental atelectasis is noted. Upper Abdomen: No acute abnormality. Musculoskeletal: No chest wall abnormality. No acute or significant osseous findings. Review of the MIP images confirms the above findings. IMPRESSION: No definite evidence of pulmonary embolus. Large sliding-type hiatal hernia. Coronary artery calcifications  are noted. Minimal bibasilar subsegmental atelectasis. Aortic Atherosclerosis (ICD10-I70.0). Electronically Signed   By: Marijo Conception M.D.   On: 06/29/2019 16:50    Pending Labs Unresulted Labs (From admission, onward)    Start     Ordered   06/29/19 2112  Culture, sputum-assessment  Once,   R     06/29/19 2111   Signed and Held  Creatinine, serum  (enoxaparin (LOVENOX)    CrCl >/= 30 ml/min)  Weekly,   R    Comments: while on enoxaparin therapy    Signed and Held   Signed and Held  Basic metabolic panel  Tomorrow morning,   R     Signed and Held   Signed and Held  CBC  Tomorrow morning,   R     Signed and Held          Vitals/Pain Today's Vitals   06/29/19 1950 06/29/19 2001 06/29/19 2006 06/29/19 2100  BP:  134/75  131/72  Pulse:  79  99  Resp:  (!) 30  (!) 27  Temp:      TempSrc:      SpO2:  96% 97% 99%  Weight:      Height:      PainSc: 0-No pain       Isolation Precautions No active isolations  Medications Medications  acetaminophen (TYLENOL) tablet 650 mg (has no administration in time range)    Or  acetaminophen (TYLENOL) suppository 650 mg (has no administration in time range)  HYDROcodone-acetaminophen (NORCO/VICODIN) 5-325 MG per tablet 1-2 tablet (has no administration in time range)  ondansetron (ZOFRAN) tablet 4 mg (has no administration in time range)    Or  ondansetron (ZOFRAN) injection 4 mg (has no administration in time range)  ipratropium-albuterol (DUONEB) 0.5-2.5 (3) MG/3ML nebulizer solution 3 mL (has no administration in time range)  methylPREDNISolone sodium succinate (SOLU-MEDROL) 125 mg/2 mL injection 60 mg (has no administration in time range)  iohexol (OMNIPAQUE) 350 MG/ML injection 100 mL (100 mLs Intravenous Contrast Given 06/29/19 1615)  albuterol (PROVENTIL,VENTOLIN) solution continuous neb (10 mg/hr Nebulization Given 06/29/19 2006)  methylPREDNISolone sodium succinate (SOLU-MEDROL) 125 mg/2 mL injection 125 mg (125 mg Intravenous  Given 06/29/19 1956)    Mobility walks with device

## 2019-06-29 NOTE — H&P (Signed)
History and Physical    Susan Landry YNW:[id number] DOB: 1928-01-12 DOA: 06/29/2019  PCP: Lajean Manes, MD   Patient coming from: [facility]   Chief Complaint: DOE, hypoxia   HPI: Susan Landry is a 85 y.o. female with medical history significant for chronic leg swelling, mild cognitive impairment, presenting to the emergency department for evaluation of worsening dyspnea and low oxygen saturations.  Patient reports a long history of exertional dyspnea but this has slowly worsened.  She has a mild chronic cough but denies any fevers or chills.  She reports chronic bilateral leg swelling that is unchanged.  She denies orthopnea.  She denies fevers or chills.  She denies chest pain.  She denies any smoking history but notes that she lived with heavy smokers, including her husband of 41 years.  She has been evaluated for this in the outpatient settings with oxygen saturations in the low 80s.  She does not have home oxygen.  She had a similar presentation in 2015, was suspected to have chronic obstructive lung disease, and was referred for PFTs, but the patient does not believe that this was ever performed.  ED Course: Upon arrival to the ED, patient is found to be afebrile, saturating mid to upper 80s on room air, tachypneic, and with stable blood pressure.  EKG features a sinus rhythm.  CTA chest is negative for PE, notable for large sliding-type hiatal hernia and minimal bibasilar atelectasis, but no edema, pneumonia, mass, or pleural effusions.  Chemistry panel is notable for a bicarbonate of 36 and elevated BUN to creatinine ratio.  CBC features a mild polycythemia.  BNP and high-sensitivity troponin are normal.  COVID-19 is negative.  Patient was given 125 mg of IV Solu-Medrol, continuous albuterol neb, and placed on supplemental oxygen in the ED.  Review of Systems:  All other systems reviewed and apart from HPI, are negative.  Past Medical History:  Diagnosis Date   Cerebral aneurysm     HYPERTENSION 07/14/2007   MILD COGNITIVE IMPAIRMENT SO STATED 07/13/2009   OSTEOARTHRITIS 01/12/2008   OVERACTIVE BLADDER 07/14/2008    Past Surgical History:  Procedure Laterality Date   BREAST SURGERY     abcess   CSF SHUNT     cerebral aneurysm   TONSILLECTOMY     TOTAL KNEE ARTHROPLASTY       reports that she has never smoked. She has never used smokeless tobacco. She reports current alcohol use. She reports that she does not use drugs.  No Known Allergies  Family History  Problem Relation Age of Onset   Cancer Father      Prior to Admission medications   Medication Sig Start Date End Date Taking? Authorizing Provider  Calcium 600-200 MG-UNIT tablet Take 1 tablet by mouth daily.   Yes [provider]  donepezil (ARICEPT) 10 MG tablet Take 10 mg by mouth at bedtime.   Yes [provider]  furosemide (LASIX) 40 MG tablet Take 40 mg by mouth 2 (two) times daily.    Yes [provider]  Multiple Vitamin (MULTIVITAMIN) tablet Take 1 tablet by mouth at bedtime.    Yes [provider]  Multiple Vitamins-Minerals (PRESERVISION AREDS 2 PO) Take 1 tablet by mouth 2 (two) times daily.   Yes [provider]  potassium chloride (KLOR-CON) 8 MEQ tablet Take 8 mEq by mouth daily.  03/29/19  Yes [provider]    Physical Exam: Vitals:   06/29/19 1900 06/29/19 1930 06/29/19 2001 06/29/19 2006  BP: 127/85 132/67 134/75   Pulse: 76 77 79   Resp: (!) 26 (!) 22 (!) 30   Temp:      TempSrc:      SpO2: 96% 99% 96% 97%  Weight:      Height:        Constitutional: NAD, calm  Eyes: PERTLA, conjunctival injection  ENMT: Mucous membranes are moist. Posterior pharynx clear of any exudate or lesions.   Neck: normal, supple, no masses, no thyromegaly Respiratory: Breath sound diminished bilaterally with prolonged expiratory phase. No pallor or cyanosis. No accessory muscle use.  Cardiovascular: S1 & S2 heard, regular rate and  rhythm. Bilateral lower leg swelling. Abdomen: No distension, no tenderness, soft. Bowel sounds active.  Musculoskeletal: no clubbing / cyanosis. No joint deformity upper and lower extremities.  Skin: no significant rashes, lesions, ulcers. Warm, dry, well-perfused. Neurologic: No facial asymmetry. Sensation intact. Moving all extremities.  Psychiatric: Alert and oriented to person, place, and situation. Very pleasant, cooperative.    Labs on Admission: I have personally reviewed following labs and imaging studies  CBC: Recent Labs  Lab 06/29/19 1138  WBC 7.1  NEUTROABS 5.1  HGB 15.3*  HCT 48.4*  MCV 94.7  PLT 226   Basic Metabolic Panel: Recent Labs  Lab 06/29/19 1138  NA 142  K 3.9  CL 97*  CO2 36*  GLUCOSE 91  BUN 25*  CREATININE 0.87  CALCIUM 8.9   GFR: Estimated Creatinine Clearance: 39.6 mL/min (by C-G formula based on SCr of 0.87 mg/dL). Liver Function Tests: Recent Labs  Lab 06/29/19 1138  AST 18  ALT 20  ALKPHOS 106  BILITOT 0.8  PROT 7.0  ALBUMIN 3.8   No results for input(s): LIPASE, AMYLASE in the last 168 hours. No results for input(s): AMMONIA in the last 168 hours. Coagulation Profile: Recent Labs  Lab 06/29/19 1138  INR 1.0   Cardiac Enzymes: No results for input(s): CKTOTAL, CKMB, CKMBINDEX, TROPONINI in the last 168 hours. BNP (last 3 results) No results for input(s): PROBNP in the last 8760 hours. HbA1C: No results for input(s): HGBA1C in the last 72 hours. CBG: No results for input(s): GLUCAP in the last 168 hours. Lipid Profile: No results for input(s): CHOL, HDL, LDLCALC, TRIG, CHOLHDL, LDLDIRECT in the last 72 hours. Thyroid Function Tests: No results for input(s): TSH, T4TOTAL, FREET4, T3FREE, THYROIDAB in the last 72 hours. Anemia Panel: No results for input(s): VITAMINB12, FOLATE, FERRITIN, TIBC, IRON, RETICCTPCT in the last 72 hours. Urine analysis:    Component Value Date/Time   COLORURINE YELLOW 05/04/2014 1055    APPEARANCEUR CLOUDY (A) 05/04/2014 1055   LABSPEC 1.009 05/04/2014 1055   PHURINE 8.0 05/04/2014 1055   GLUCOSEU NEGATIVE 05/04/2014 1055   HGBUR NEGATIVE 05/04/2014 1055   HGBUR negative 01/04/2010 0808   BILIRUBINUR NEGATIVE 05/04/2014 1055   BILIRUBINUR n 07/16/2011 1320   KETONESUR NEGATIVE 05/04/2014 1055   PROTEINUR NEGATIVE 05/04/2014 1055   UROBILINOGEN 0.2 05/04/2014 1055   NITRITE NEGATIVE 05/04/2014 1055   LEUKOCYTESUR SMALL (A) 05/04/2014 1055   Sepsis Labs: @LABRCNTIP (procalcitonin:4,lacticidven:4) ) Recent Results (from the past 240 hour(s))  SARS CORONAVIRUS 2 (TAT 6-24 HRS) Nasopharyngeal Nasopharyngeal Swab     Status: None   Collection Time: 06/29/19 11:38 AM   Specimen: Nasopharyngeal Swab  Result Value Ref Range Status   SARS Coronavirus 2 NEGATIVE NEGATIVE Final    Comment: (NOTE) SARS-CoV-2 target nucleic acids are NOT DETECTED. The SARS-CoV-2 RNA is generally detectable in upper and lower  respiratory specimens during the acute phase of infection. Negative results do not preclude SARS-CoV-2 infection, do not rule out co-infections with other pathogens, and should not be used as the sole basis for treatment or other patient management decisions. Negative results must be combined with clinical observations, patient history, and epidemiological information. The expected result is Negative. Fact Sheet for Patients: HairSlick.no Fact Sheet for Healthcare Providers: quierodirigir.com This test is not yet approved or cleared by the [place] FDA and  has been authorized for detection and/or diagnosis of SARS-CoV-2 by FDA under an Emergency Use Authorization (EUA). This EUA will remain  in effect (meaning this test can be used) for the duration of the COVID-19 declaration under Section 56 4(b)(1) of the Act, 21 U.S.C. section 360bbb-3(b)(1), unless the authorization is terminated or revoked  sooner. Performed at [facility], [address], [place], [place] [address]      Radiological Exams on Admission: Dg Chest 2 View  Result Date: 06/29/2019 CLINICAL DATA:  Persistent elevation of the right hemidiaphragm. Stable hiatal hernia. No acute abnormality noted. EXAM: CHEST - 2 VIEW COMPARISON:  09/27/2014 FINDINGS: Cardiac shadow is at the upper limits of normal but stable. Aortic calcifications are seen. Elevation of the right hemidiaphragm is noted. Hiatal hernia is better visualized on today's exam. No focal infiltrate or sizable effusion is seen. Shunt catheter is noted along the right anterior chest wall. IMPRESSION: Chronic changes as described.  No acute abnormality noted. Electronically Signed   By: Alcide Clever M.D.   On: 06/29/2019 11:05   Ct Angio Chest Pe W/cm &/or Wo Cm  Result Date: 06/29/2019 CLINICAL DATA:  Chest pain. EXAM: CT ANGIOGRAPHY CHEST WITH CONTRAST TECHNIQUE: Multidetector CT imaging of the chest was performed using the standard protocol during bolus administration of intravenous contrast. Multiplanar CT image reconstructions and MIPs were obtained to evaluate the vascular anatomy. CONTRAST:  OMNIPAQUE IOHEXOL 350 MG/ML SOLN COMPARISON:  None. FINDINGS: Cardiovascular: Satisfactory opacification of the pulmonary arteries to the segmental level. No evidence of pulmonary embolism. Normal heart size. No pericardial effusion. Coronary artery calcifications are noted. Atherosclerosis of thoracic aorta is noted. Mediastinum/Nodes: Large sliding-type hiatal hernia is noted. No adenopathy is noted. Thyroid gland is unremarkable. Lungs/Pleura: No pneumothorax or pleural effusion is noted. Elevated right hemidiaphragm is noted. Minimal bibasilar subsegmental atelectasis is noted. Upper Abdomen: No acute abnormality. Musculoskeletal: No chest wall abnormality. No acute or significant osseous findings. Review of the MIP images confirms the above findings.  IMPRESSION: No definite evidence of pulmonary embolus. Large sliding-type hiatal hernia. Coronary artery calcifications are noted. Minimal bibasilar subsegmental atelectasis. Aortic Atherosclerosis (ICD10-I70.0). Electronically Signed   By: Lupita Raider M.D.   On: 06/29/2019 16:50    EKG: Independently reviewed. Sinus rhythm, rate 72, QTc 489 ms.   Assessment/Plan   1. Hypoxic respiratory failure; bronchospasm  - Presents with progressive chronic DOE, found to have O2 saturations in 80's on rm air with no PE, pneumonia, edema, mass, or pleural effusion on CTA chest  - Her husband of 65 years was a heavy smoker and the patient was suspected of having COPD in 2015 when she had a similar presentation but not clear that she ever had the PFT that was planned  - She was treated with supplemental O2, albuterol neb, and 125 mg IV Solu-Medrol in ED  - Continue systemic steroid, start duonebs, continue supplemental O2 as needed   2. Chronic leg swelling  - She reports this to be  unchanged, denies orthopnea, and has normal BNP  - Continue compression wraps, Lasix    3. Dementia  - Continue Aricept     PPE: Mask, face shield  DVT prophylaxis: Lovenox  Code Status: Full  Family Communication: Discussed with patient   Consults called: None Admission status: Observation     Briscoe Deutscher, MD [facility] Pager [phone]  If 7PM-7AM, please contact night-coverage [email or web address] Password [facility]  06/29/2019, 8:37 PM

## 2019-06-29 NOTE — ED Provider Notes (Signed)
[facility]-EMERGENCY DEPT Provider Note   CSN: [id number] Arrival date & time: 06/29/19  6503     History   Chief Complaint Chief Complaint  Patient presents with  . Abnormal Chest x-ray    HPI Susan Landry is a 85 y.o. female.     HPI Patient has been fairly inactive recently due to Covid and spending a lot of time in her apartment.  She lives in an assisted living.  Her doctor ordered physical therapy.  He started going last week and they monitored her oxygen level.  She was documented to have a hypoxia to the mid 70s.  She went back again this week and they documented mid 2s.  The patient reports she does not wear home oxygen.  She reports that she has felt short of breath with exertion for quite a while.  She did not notice that it was significantly worse.  She did see her doctor and an outpatient portable x-ray suggested significant abnormality of possibly pneumonia or CHF.  She was referred to the emergency department.  Patient reports she has had a very mild cough just recently.  She has not had fever or body aches.  She has had some slight nasal congestion.  She reports no chest pain.  Her legs are chronically swollen.  She reports that they put on compression hose and support wraps which are effective in significantly reducing the amount of swelling that she chronically experiences.  She has not noted any change. Past Medical History:  Diagnosis Date  . Cerebral aneurysm   . HYPERTENSION 07/14/2007  . MILD COGNITIVE IMPAIRMENT SO STATED 07/13/2009  . OSTEOARTHRITIS 01/12/2008  . OVERACTIVE BLADDER 07/14/2008    Patient Active Problem List   Diagnosis Date Noted  . Pain due to onychomycosis of toenails of both feet 04/21/2019  . Diabetic neuropathy (HCC) 04/21/2019  . Angioedema 05/04/2014  . Acute diastolic heart failure (HCC) 10/06/2013  . Influenza with respiratory manifestations 10/06/2013  . Fall 10/03/2013  . Ear abrasion 10/03/2013  . Acute  respiratory failure with hypoxia (HCC) 10/03/2013  . Wheezing 10/03/2013  . DOE (dyspnea on exertion) 06/11/2011  . MILD COGNITIVE IMPAIRMENT SO STATED 07/13/2009  . OVERACTIVE BLADDER 07/14/2008  . OSTEOARTHRITIS 01/12/2008  . HYPERTENSION 07/14/2007    Past Surgical History:  Procedure Laterality Date  . BREAST SURGERY     abcess  . CSF SHUNT     cerebral aneurysm  . TONSILLECTOMY    . TOTAL KNEE ARTHROPLASTY       OB History   No obstetric history on file.      Home Medications    Prior to Admission medications   Medication Sig Start Date End Date Taking? Authorizing Provider  albuterol (PROVENTIL) (2.5 MG/3ML) 0.083% nebulizer solution Take 2.5 mg by nebulization every 4 (four) hours as needed for wheezing or shortness of breath.    [provider]  aspirin 325 MG tablet Take 325 mg by mouth daily.      [provider]  Calcium Carbonate-Vitamin D (CALCIUM 600 + D PO) Take 1 tablet by mouth daily.     [provider]  ciprofloxacin (CIPRO) 250 MG tablet  01/13/19   [provider]  clindamycin (CLEOCIN) 150 MG capsule Take 600 mg by mouth See admin instructions. Takes 600mg  1 hour prior to dental appointment    [provider]  donepezil (ARICEPT) 10 MG tablet Take 10 mg by mouth at bedtime.    [provider]  furosemide (LASIX) 40 MG tablet Take 40 mg by mouth daily with breakfast.     [provider]  ipratropium (ATROVENT) 0.02 % nebulizer solution Take 0.5 mg by nebulization every 4 (four) hours as needed for wheezing or shortness of breath.    [provider]  Multiple Vitamin (MULTIVITAMIN) tablet Take 1 tablet by mouth at bedtime.     [provider]  potassium chloride (KLOR-CON) 8 MEQ tablet  03/29/19   [provider]    Family History Family History  Problem Relation Age of Onset  . Cancer Father     Social History Social History   Tobacco Use  . Smoking status:  Never Smoker  . Smokeless tobacco: Never Used  Substance Use Topics  . Alcohol use: Yes  . Drug use: No     Allergies   Patient has no known allergies.   Review of Systems Review of Systems 10 Systems reviewed and are negative for acute change except as noted in the HPI.   Physical Exam Updated Vital Signs BP (!) 124/91 (BP Location: Right Arm)   Pulse 86   Temp 98 F (36.7 C) (Oral)   Resp (!) 22   Ht 5' (1.524 m)   Wt 74.8 kg   SpO2 93%   BMI 32.22 kg/m   Physical Exam Constitutional:      Comments: Patient is alert pleasant and interactive.  No respiratory distress at rest.  She is currently wearing 2 L nasal cannula oxygen.  HENT:     Head: Normocephalic and atraumatic.  Eyes:     Extraocular Movements: Extraocular movements intact.     Conjunctiva/sclera: Conjunctivae normal.  Cardiovascular:     Rate and Rhythm: Normal rate and regular rhythm.  Pulmonary:     Comments: No respiratory distress at rest.  Patient had slight basilar crackle and wheeze that did seem to improve with deep inspiration. Abdominal:     General: There is no distension.     Palpations: Abdomen is soft.     Tenderness: There is no abdominal tenderness. There is no guarding.  Musculoskeletal:     Comments: Patient is currently wearing compression hose as well as external Velcro compression devices.  Skin:    General: Skin is warm and dry.  Neurological:     General: No focal deficit present.     Mental Status: She is oriented to person, place, and time.     Coordination: Coordination normal.  Psychiatric:        Mood and Affect: Mood normal.      ED Treatments / Results  Labs (all labs ordered are listed, but only abnormal results are displayed) Labs Reviewed - No data to display  EKG None  Radiology Dg Chest 2 View  Result Date: 06/29/2019 CLINICAL DATA:  Persistent elevation of the right hemidiaphragm. Stable hiatal hernia. No acute abnormality noted. EXAM: CHEST - 2  VIEW COMPARISON:  09/27/2014 FINDINGS: Cardiac shadow is at the upper limits of normal but stable. Aortic calcifications are seen. Elevation of the right hemidiaphragm is noted. Hiatal hernia is better visualized on today's exam. No focal infiltrate or sizable effusion is seen. Shunt catheter is noted along the right anterior chest wall. IMPRESSION: Chronic changes as described.  No acute abnormality noted. Electronically Signed   By: Alcide CleverMark  Lukens M.D.   On: 06/29/2019 11:05    Procedures Procedures (including critical care time)  Medications Ordered in ED Medications - No data to display  Initial Impression / Assessment and Plan / ED Course  I have reviewed the triage vital signs and the nursing notes.  Pertinent labs & imaging results that were available during my care of the patient were reviewed by me and considered in my medical decision making (see chart for details).       Patient is referred to emergency department for hypoxia.  Initial work-up is not showing findings highly suggestive of CHF.  Covid test is pending.  Due to hypoxia and immobility, will proceed with CT PE study.  Patient does not have pleuritic chest pain.  She is not in respiratory distress at rest.  We will be awaiting results for definitive plan.  If no significant findings identified by PE study or Covid testing, plan to ambulate on oxygen to determine patient's exercise tolerance on room air.  Dr.Wentz to follow results and make final dispo.  Final Clinical Impressions(s) / ED Diagnoses   Final diagnoses:  None    ED Discharge Orders    None       Charlesetta Shanks, MD 06/29/19 [phone]

## 2019-06-29 NOTE — ED Provider Notes (Signed)
3:45 PM-checkout from prior provider to evaluate for possible PE after CT imaging.  She felt patient can be discharged home, if no complications were found.  Additionally, Covid testing has been ordered, and returned.  Patient currently presented for evaluation of hypoxia, as well as shortness of breath sensation.  Symptoms subacute.  Clinical Course as of Jun 28 1758  Tue Jun 29, 2019  1757 Normal  Troponin I (High Sensitivity) [EW]  1757 Normal  Brain natriuretic peptide [EW]  1757 Normal  Protime-INR [EW]  1758 Normal except slight elevation of hemoglobin  CBC with Differential(!) [EW]  1758 Normal  SARS CORONAVIRUS 2 (TAT 6-24 HRS) Nasopharyngeal Nasopharyngeal Swab [EW]  1758 Normal except chloride low, CO2 high, BUN high   [EW]  1758 No PE or infiltrate, images reviewed by me  CT Angio Chest PE W/Cm &/Or Wo Cm [EW]  1758 No infiltrate or CHF, images reviewed by me  DG Chest 2 View [EW]    Clinical Course User Index [EW] Mancel Bale, MD   Medications  iohexol (OMNIPAQUE) 350 MG/ML injection 100 mL (100 mLs Intravenous Contrast Given 06/29/19 1615)  albuterol (PROVENTIL,VENTOLIN) solution continuous neb (10 mg/hr Nebulization Given 06/29/19 2006)  methylPREDNISolone sodium succinate (SOLU-MEDROL) 125 mg/2 mL injection 125 mg (125 mg Intravenous Given 06/29/19 1956)     Patient Vitals for the past 24 hrs:  BP Temp Temp src Pulse Resp SpO2 Height Weight  06/29/19 1715 131/83 - - 83 (!) 22 96 % - -  06/29/19 1700 131/83 - - 78 16 95 % - -  06/29/19 1600 123/75 - - - 20 97 % - -  06/29/19 1530 132/76 - - 75 20 100 % - -  06/29/19 1500 129/67 - - 72 (!) 21 100 % - -  06/29/19 1430 129/71 - - 68 (!) 23 94 % - -  06/29/19 1330 121/80 - - 79 16 100 % - -  06/29/19 1200 124/77 - - 68 (!) 26 98 % - -  06/29/19 1136 127/85 - - 74 (!) 27 99 % - -  06/29/19 1008 - - - - - - 5' (1.524 m) 74.8 kg  06/29/19 1005 (!) 124/91 98 F (36.7 C) Oral 86 (!) 22 93 % - -    7:05 PM  Reevaluation with update and discussion. After initial assessment and treatment, an updated evaluation reveals hypoxic on room air, less than 70, without respiratory distress.  Lungs with decreased air movement bilaterally, and generalized wheezing.  Mild increased work of breathing.  Daughter and patient updated on findings and plan. Mancel Bale   .Critical Care Performed by: Mancel Bale, MD Authorized by: Mancel Bale, MD   Critical care provider statement:    Critical care time (minutes):  35   Critical care start time:  06/29/2019 4:00 PM   Critical care end time:  06/29/2019 8:22 PM   Critical care time was exclusive of:  Separately billable procedures and treating other patients   Critical care was necessary to treat or prevent imminent or life-threatening deterioration of the following conditions:  Respiratory failure   Critical care was time spent personally by me on the following activities:  Blood draw for specimens, development of treatment plan with patient or surrogate, discussions with consultants, evaluation of patient's response to treatment, examination of patient, obtaining history from patient or surrogate, ordering and performing treatments and interventions, ordering and review of laboratory studies, pulse oximetry, re-evaluation of patient's condition, review of old charts and ordering  and review of radiographic studies    Medical Decision Making: Evaluation consistent with COPD exacerbation with significant hypoxia.  Doubt pneumonia, PE or serious bacterial infection.  Patient will require admission for stabilization.  CRITICAL CARE-yes Performed by: Daleen Bo  Nursing Notes Reviewed/ Care Coordinated Applicable Imaging Reviewed Interpretation of Laboratory Data incorporated into ED treatment   8:22 PM-Consult complete with Hospitalist. Patient case explained and discussed. He agrees to admit patient for further evaluation and treatment. Call ended at 8:31  PM  Plan: Admit   Daleen Bo, MD 06/30/19 1148

## 2019-06-29 NOTE — ED Triage Notes (Signed)
Pt resides at [facility], Chest X-ray yesterday was obtained, she was called this am and told she has fluid on her lungs to come to the hospital.Sats 89% on RA in Triage

## 2019-06-29 NOTE — ED Notes (Signed)
Respiratory called for continuous neb treatment.

## 2019-06-30 ENCOUNTER — Observation Stay (HOSPITAL_COMMUNITY): Payer: Medicare Other

## 2019-06-30 DIAGNOSIS — J9621 Acute and chronic respiratory failure with hypoxia: Secondary | ICD-10-CM | POA: Diagnosis not present

## 2019-06-30 DIAGNOSIS — I10 Essential (primary) hypertension: Secondary | ICD-10-CM | POA: Diagnosis present

## 2019-06-30 DIAGNOSIS — Z7982 Long term (current) use of aspirin: Secondary | ICD-10-CM | POA: Diagnosis not present

## 2019-06-30 DIAGNOSIS — Z20828 Contact with and (suspected) exposure to other viral communicable diseases: Secondary | ICD-10-CM | POA: Diagnosis present

## 2019-06-30 DIAGNOSIS — R944 Abnormal results of kidney function studies: Secondary | ICD-10-CM | POA: Diagnosis present

## 2019-06-30 DIAGNOSIS — B351 Tinea unguium: Secondary | ICD-10-CM | POA: Diagnosis present

## 2019-06-30 DIAGNOSIS — F0390 Unspecified dementia without behavioral disturbance: Secondary | ICD-10-CM | POA: Diagnosis present

## 2019-06-30 DIAGNOSIS — R0902 Hypoxemia: Secondary | ICD-10-CM | POA: Diagnosis present

## 2019-06-30 DIAGNOSIS — R451 Restlessness and agitation: Secondary | ICD-10-CM | POA: Diagnosis not present

## 2019-06-30 DIAGNOSIS — R05 Cough: Secondary | ICD-10-CM | POA: Diagnosis present

## 2019-06-30 DIAGNOSIS — Z96659 Presence of unspecified artificial knee joint: Secondary | ICD-10-CM | POA: Diagnosis present

## 2019-06-30 DIAGNOSIS — D751 Secondary polycythemia: Secondary | ICD-10-CM | POA: Diagnosis present

## 2019-06-30 DIAGNOSIS — R600 Localized edema: Secondary | ICD-10-CM

## 2019-06-30 DIAGNOSIS — Z66 Do not resuscitate: Secondary | ICD-10-CM | POA: Diagnosis present

## 2019-06-30 DIAGNOSIS — I878 Other specified disorders of veins: Secondary | ICD-10-CM | POA: Diagnosis present

## 2019-06-30 DIAGNOSIS — M7989 Other specified soft tissue disorders: Secondary | ICD-10-CM | POA: Diagnosis present

## 2019-06-30 DIAGNOSIS — R410 Disorientation, unspecified: Secondary | ICD-10-CM | POA: Diagnosis not present

## 2019-06-30 DIAGNOSIS — Z79899 Other long term (current) drug therapy: Secondary | ICD-10-CM | POA: Diagnosis not present

## 2019-06-30 DIAGNOSIS — Z9981 Dependence on supplemental oxygen: Secondary | ICD-10-CM | POA: Diagnosis not present

## 2019-06-30 DIAGNOSIS — J441 Chronic obstructive pulmonary disease with (acute) exacerbation: Secondary | ICD-10-CM | POA: Diagnosis not present

## 2019-06-30 DIAGNOSIS — N3281 Overactive bladder: Secondary | ICD-10-CM | POA: Diagnosis present

## 2019-06-30 DIAGNOSIS — M1990 Unspecified osteoarthritis, unspecified site: Secondary | ICD-10-CM | POA: Diagnosis present

## 2019-06-30 DIAGNOSIS — I671 Cerebral aneurysm, nonruptured: Secondary | ICD-10-CM | POA: Diagnosis present

## 2019-06-30 DIAGNOSIS — J9691 Respiratory failure, unspecified with hypoxia: Secondary | ICD-10-CM | POA: Diagnosis not present

## 2019-06-30 DIAGNOSIS — Z7722 Contact with and (suspected) exposure to environmental tobacco smoke (acute) (chronic): Secondary | ICD-10-CM | POA: Diagnosis present

## 2019-06-30 DIAGNOSIS — R0600 Dyspnea, unspecified: Secondary | ICD-10-CM

## 2019-06-30 DIAGNOSIS — J9801 Acute bronchospasm: Secondary | ICD-10-CM | POA: Diagnosis present

## 2019-06-30 DIAGNOSIS — J9601 Acute respiratory failure with hypoxia: Secondary | ICD-10-CM | POA: Diagnosis present

## 2019-06-30 DIAGNOSIS — G3184 Mild cognitive impairment, so stated: Secondary | ICD-10-CM | POA: Diagnosis not present

## 2019-06-30 LAB — CBC
HCT: 45.8 % (ref 36.0–46.0)
Hemoglobin: 14.3 g/dL (ref 12.0–15.0)
MCH: 30 pg (ref 26.0–34.0)
MCHC: 31.2 g/dL (ref 30.0–36.0)
MCV: 96 fL (ref 80.0–100.0)
Platelets: 211 10*3/uL (ref 150–400)
RBC: 4.77 MIL/uL (ref 3.87–5.11)
RDW: 15 % (ref 11.5–15.5)
WBC: 8.3 10*3/uL (ref 4.0–10.5)
nRBC: 0 % (ref 0.0–0.2)

## 2019-06-30 LAB — ECHOCARDIOGRAM COMPLETE
Height: 60 in
Weight: 2610.25 oz

## 2019-06-30 LAB — BASIC METABOLIC PANEL
Anion gap: 12 (ref 5–15)
BUN: 23 mg/dL (ref 8–23)
CO2: 30 mmol/L (ref 22–32)
Calcium: 8.8 mg/dL — ABNORMAL LOW (ref 8.9–10.3)
Chloride: 96 mmol/L — ABNORMAL LOW (ref 98–111)
Creatinine, Ser: 0.77 mg/dL (ref 0.44–1.00)
GFR calc Af Amer: >60 mL/min (ref >60)
GFR calc non Af Amer: >60 mL/min (ref >60)
Glucose, Bld: 163 mg/dL — ABNORMAL HIGH (ref 70–99)
Potassium: 3.8 mmol/L (ref 3.5–5.1)
Sodium: 138 mmol/L (ref 135–145)

## 2019-06-30 LAB — GLUCOSE, CAPILLARY
Glucose-Capillary: 152 mg/dL — ABNORMAL HIGH (ref 70–99)
Glucose-Capillary: 153 mg/dL — ABNORMAL HIGH (ref 70–99)

## 2019-06-30 MED ORDER — PREDNISONE 20 MG PO TABS
40.0000 mg | ORAL_TABLET | Freq: Every day | ORAL | Status: DC
  Administered 2019-07-01: 40 mg via ORAL
  Filled 2019-06-30: qty 2

## 2019-06-30 MED ORDER — POTASSIUM CHLORIDE CRYS ER 10 MEQ PO TBCR
10.0000 meq | EXTENDED_RELEASE_TABLET | Freq: Every day | ORAL | Status: DC
  Administered 2019-06-30 – 2019-07-03 (×4): 10 meq via ORAL
  Filled 2019-06-30 (×6): qty 1

## 2019-06-30 NOTE — Progress Notes (Signed)
  Echocardiogram 2D Echocardiogram has been performed.  Susan Landry 06/30/2019, 12:10 PM

## 2019-06-30 NOTE — Progress Notes (Signed)
PROGRESS NOTE    Susan LatusJanet F Meints  ZOX:[id number] DOB: [birth year] DOA: 06/29/2019 PCP: Merlene LaughterStoneking, Hal, MD    Brief Narrative:   Susan Landry is a 85 y.o. female with medical history significant for chronic leg swelling, mild cognitive impairment, presenting to the emergency department for evaluation of worsening dyspnea and low oxygen saturations.  Patient reports a long history of exertional dyspnea but this has slowly worsened.  She has a mild chronic cough but denies any fevers or chills.  She reports chronic bilateral leg swelling that is unchanged.  She denies orthopnea.  She denies fevers or chills.  She denies chest pain.  She denies any smoking history but notes that she lived with heavy smokers, including her husband of 65 years.  She has been evaluated for this in the outpatient settings with oxygen saturations in the low 80s.  She does not have home oxygen.  She had a similar presentation in 2015, was suspected to have chronic obstructive lung disease, and was referred for PFTs, but the patient does not believe that this was ever performed.  ED Course: Upon arrival to the ED, patient is found to be afebrile, saturating mid to upper 80s on room air, tachypneic, and with stable blood pressure.  EKG features a sinus rhythm.  CTA chest is negative for PE, notable for large sliding-type hiatal hernia and minimal bibasilar atelectasis, but no edema, pneumonia, mass, or pleural effusions.  Chemistry panel is notable for a bicarbonate of 36 and elevated BUN to creatinine ratio.  CBC features a mild polycythemia.  BNP and high-sensitivity troponin are normal.  COVID-19 is negative.  Patient was given 125 mg of IV Solu-Medrol, continuous albuterol neb, and placed on supplemental oxygen in the ED.  Assessment & Plan:   Principal Problem:   Respiratory failure with hypoxia (HCC) Active Problems:   MILD COGNITIVE IMPAIRMENT SO STATED   Bronchospasm   Acute hypoxic respiratory  failure Suspected underlying COPD Patient presenting with progressive dyspnea and chronic nonproductive cough.  In the ED, patient was found to have an oxygen saturation of 80% on room air.  Chest x-ray negative for focal consolidation/effusion or vascular congestion.  CT angiogram chest negative for pulmonary embolism.  P 74.0, high-sensitivity troponin III, unrevealing.  Afebrile without leukocytosis. --Echocardiogram: Pending --Transition IV Solu-Medrol to oral prednisone, 40 mg PO daily tomorrow --continue nebs prn --Ambulated on room air today with desaturation to 80%, recovered on 3 L nasal cannula --Likely will need home O2 versus SNF/ALF placement --PT/OT evaluation for discharge needs, currently lives alone  Chronic lower extremity edema No significant change in the amount of lower extremity edema, no orthopnea, BNP 74 within normal limits. --Echocardiogram as above --Continue home furosemide 40 mg p.o. twice daily  Dementia: Continue Aricept   DVT prophylaxis: Lovenox Code Status: Full code Family Communication: None present at bedside Disposition Plan: Continue inpatient hospitalization, awaiting therapy evaluation/input, will need home oxygen and possible SNF versus ALF placement.   Consultants:   None  Procedures:   Echocardiogram: Pending  Antimicrobials:   None   Subjective: Patient seen and examined at bedside, resting comfortably.  Eating breakfast.  Complaining of her nasal cannula has been dislodged.  Mildly confused.  Complains of shortness of breath.  No other complaints or concerns at this time.  Denies headache, no chest pain, no palpitations, no abdominal pain, no weakness/fatigue, no paresthesias.  No acute events overnight per nursing staff.  Objective: Vitals:   06/29/19 2223 06/30/19 0500 06/30/19 [id number]  06/30/19 1047  BP: 121/65  118/63   Pulse: (!) 110  95 85  Resp: 20  17   Temp: 98.3 F (36.8 C)  97.8 F (36.6 C)   TempSrc: Oral  Oral    SpO2: (!) 87%  (!) 88% (!) 88%  Weight: 74 kg 74 kg    Height: 5' (1.524 m)       Intake/Output Summary (Last 24 hours) at 06/30/2019 1343 Last data filed at 06/30/2019 1100 Gross per 24 hour  Intake 480 ml  Output 551 ml  Net -71 ml   Filed Weights   06/29/19 1008 06/29/19 2223 06/30/19 0500  Weight: 74.8 kg 74 kg 74 kg    Examination:  General exam: Appears calm and comfortable, pleasantly confused Respiratory system: Coarse breath sounds bilaterally slightly decreased bilateral bases, no wheezing, normal respiratory effort, on 3 L nasal cannula satting 88%. Cardiovascular system: S1 & S2 heard, RRR. No JVD, murmurs, rubs, gallops or clicks. No pedal edema. Gastrointestinal system: Abdomen is nondistended, soft and nontender. No organomegaly or masses felt. Normal bowel sounds heard. Central nervous system: Alert. No focal neurological deficits. Extremities: Symmetric 5 x 5 power. Skin: No rashes, lesions or ulcers Psychiatry: Poor insight and judgment, appropriate mood and affect.    Data Reviewed: I have personally reviewed following labs and imaging studies  CBC: Recent Labs  Lab 06/29/19 1138 06/30/19 0530  WBC 7.1 8.3  NEUTROABS 5.1  --   HGB 15.3* 14.3  HCT 48.4* 45.8  MCV 94.7 96.0  PLT 226 211   Basic Metabolic Panel: Recent Labs  Lab 06/29/19 1138 06/30/19 0530  NA 142 138  K 3.9 3.8  CL 97* 96*  CO2 36* 30  GLUCOSE 91 163*  BUN 25* 23  CREATININE 0.87 0.77  CALCIUM 8.9 8.8*   GFR: Estimated Creatinine Clearance: 42.8 mL/min (by C-G formula based on SCr of 0.77 mg/dL). Liver Function Tests: Recent Labs  Lab 06/29/19 1138  AST 18  ALT 20  ALKPHOS 106  BILITOT 0.8  PROT 7.0  ALBUMIN 3.8   No results for input(s): LIPASE, AMYLASE in the last 168 hours. No results for input(s): AMMONIA in the last 168 hours. Coagulation Profile: Recent Labs  Lab 06/29/19 1138  INR 1.0   Cardiac Enzymes: No results for input(s): CKTOTAL, CKMB,  CKMBINDEX, TROPONINI in the last 168 hours. BNP (last 3 results) No results for input(s): PROBNP in the last 8760 hours. HbA1C: No results for input(s): HGBA1C in the last 72 hours. CBG: Recent Labs  Lab 06/30/19 0749 06/30/19 0751  GLUCAP 152* 153*   Lipid Profile: No results for input(s): CHOL, HDL, LDLCALC, TRIG, CHOLHDL, LDLDIRECT in the last 72 hours. Thyroid Function Tests: No results for input(s): TSH, T4TOTAL, FREET4, T3FREE, THYROIDAB in the last 72 hours. Anemia Panel: No results for input(s): VITAMINB12, FOLATE, FERRITIN, TIBC, IRON, RETICCTPCT in the last 72 hours. Sepsis Labs: No results for input(s): PROCALCITON, LATICACIDVEN in the last 168 hours.  Recent Results (from the past 240 hour(s))  SARS CORONAVIRUS 2 (TAT 6-24 HRS) Nasopharyngeal Nasopharyngeal Swab     Status: None   Collection Time: 06/29/19 11:38 AM   Specimen: Nasopharyngeal Swab  Result Value Ref Range Status   SARS Coronavirus 2 NEGATIVE NEGATIVE Final    Comment: (NOTE) SARS-CoV-2 target nucleic acids are NOT DETECTED. The SARS-CoV-2 RNA is generally detectable in upper and lower respiratory specimens during the acute phase of infection. Negative results do not preclude SARS-CoV-2 infection, do not  rule out co-infections with other pathogens, and should not be used as the sole basis for treatment or other patient management decisions. Negative results must be combined with clinical observations, patient history, and epidemiological information. The expected result is Negative. Fact Sheet for Patients: SugarRoll.be Fact Sheet for Healthcare Providers: https://www.woods-mathews.com/ This test is not yet approved or cleared by the [place] FDA and  has been authorized for detection and/or diagnosis of SARS-CoV-2 by FDA under an Emergency Use Authorization (EUA). This EUA will remain  in effect (meaning this test can be used) for the duration of  the COVID-19 declaration under Section 56 4(b)(1) of the Act, 21 U.S.C. section 360bbb-3(b)(1), unless the authorization is terminated or revoked sooner. Performed at [facility], [place] [address], [place], [place] [address]          Radiology Studies: Dg Chest 2 View  Result Date: 06/29/2019 CLINICAL DATA:  Persistent elevation of the right hemidiaphragm. Stable hiatal hernia. No acute abnormality noted. EXAM: CHEST - 2 VIEW COMPARISON:  09/27/2014 FINDINGS: Cardiac shadow is at the upper limits of normal but stable. Aortic calcifications are seen. Elevation of the right hemidiaphragm is noted. Hiatal hernia is better visualized on today's exam. No focal infiltrate or sizable effusion is seen. Shunt catheter is noted along the right anterior chest wall. IMPRESSION: Chronic changes as described.  No acute abnormality noted. Electronically Signed   By: Inez Catalina M.D.   On: 06/29/2019 11:05   Ct Angio Chest Pe W/cm &/or Wo Cm  Result Date: 06/29/2019 CLINICAL DATA:  Chest pain. EXAM: CT ANGIOGRAPHY CHEST WITH CONTRAST TECHNIQUE: Multidetector CT imaging of the chest was performed using the standard protocol during bolus administration of intravenous contrast. Multiplanar CT image reconstructions and MIPs were obtained to evaluate the vascular anatomy. CONTRAST:  184mL OMNIPAQUE IOHEXOL 350 MG/ML SOLN COMPARISON:  None. FINDINGS: Cardiovascular: Satisfactory opacification of the pulmonary arteries to the segmental level. No evidence of pulmonary embolism. Normal heart size. No pericardial effusion. Coronary artery calcifications are noted. Atherosclerosis of thoracic aorta is noted. Mediastinum/Nodes: Large sliding-type hiatal hernia is noted. No adenopathy is noted. Thyroid gland is unremarkable. Lungs/Pleura: No pneumothorax or pleural effusion is noted. Elevated right hemidiaphragm is noted. Minimal bibasilar subsegmental atelectasis is noted. Upper Abdomen: No acute abnormality.  Musculoskeletal: No chest wall abnormality. No acute or significant osseous findings. Review of the MIP images confirms the above findings. IMPRESSION: No definite evidence of pulmonary embolus. Large sliding-type hiatal hernia. Coronary artery calcifications are noted. Minimal bibasilar subsegmental atelectasis. Aortic Atherosclerosis (ICD10-I70.0). Electronically Signed   By: Marijo Conception M.D.   On: 06/29/2019 16:50        Scheduled Meds:  donepezil  10 mg Oral QHS   enoxaparin (LOVENOX) injection  40 mg Subcutaneous QHS   furosemide  40 mg Oral BID   methylPREDNISolone (SOLU-MEDROL) injection  60 mg Intravenous Q6H   potassium chloride  10 mEq Oral Daily   sodium chloride flush  3 mL Intravenous Q12H   Continuous Infusions:  sodium chloride       LOS: 0 days    Time spent: 32 minutes spent on chart review, discussion with nursing staff, consultants, person reviewing all imaging studies and labs, updating family and interview/physical exam; more than 50% of that time was spent in counseling and/or coordination of care.    Gracen Ringwald J [place] DO [facility] Pager [phone]  If 7PM-7AM, please contact night-coverage [email or web address] Password [email or web address] 06/30/2019, 1:43 PM

## 2019-06-30 NOTE — Evaluation (Signed)
Physical Therapy Evaluation Patient Details Name: Susan Landry MRN: [id number] DOB: February 11, 1928 Today's Date: 06/30/2019   History of Present Illness  85 yo female admitted with respiratory failure. Hx of dementia, COPD, cerebral aneurysm  Clinical Impression  On eval, pt required Min assist for mobility. She walked ~60 feet using a RW for safety. SpO2: 97% 3L [place] at rest, 88-89% 3L [place] during ambulation. Pt is unsteady and unsafe at times. She appears mildly confused. No family present during session. Recommend SNF if pt/family are agreeable. If pt returns home, recommend HHPT and 24 hour supervision/assist. May need to consider ALF placement in the near future.     Follow Up Recommendations SNF;Supervision/Assistance - 24 hour(if pt /family are agreeable. May need to consider ALF placement.)    Equipment Recommendations  None recommended by PT    Recommendations for Other Services       Precautions / Restrictions Precautions Precautions: Fall Precaution Comments: monitor O2 Restrictions Weight Bearing Restrictions: No      Mobility  Bed Mobility Overal bed mobility: Needs Assistance Bed Mobility: Supine to Sit;Sit to Supine     Supine to sit: Supervision;HOB elevated Sit to supine: Supervision;HOB elevated   General bed mobility comments: for safety, lines  Transfers Overall transfer level: Needs assistance Equipment used: 4-wheeled walker Transfers: Sit to/from Stand Sit to Stand: Min assist         General transfer comment: VCs safety, hand placement, proper operation of rollator  Ambulation/Gait Ambulation/Gait assistance: Min assist Gait Distance (Feet): 60 Feet Assistive device: 4-wheeled walker Gait Pattern/deviations: Step-through pattern;Decreased stride length;Trunk flexed     General Gait Details: VCs safety, distance from rollator, proper operation of rollator. Assist to stabilize throughout distances. O2: 88-89% on 3L [place]. Dyspnea 2/4.  Stairs            Wheelchair Mobility    Modified Rankin (Stroke Patients Only)       Balance Overall balance assessment: Needs assistance;History of Falls         Standing balance support: Bilateral upper extremity supported Standing balance-Leahy Scale: Poor                               Pertinent Vitals/Pain Pain Assessment: No/denies pain    Home Living Family/patient expects to be discharged to:: Unsure Living Arrangements: Alone   Type of Home: Apartment Home Access: Stairs to enter   Entrance Stairs-Number of Steps: 3 Home Layout: One level Home Equipment: Walker - 4 wheels;Cane - single point      Prior Function Level of Independence: Independent with assistive device(s)         Comments: uses RW vs cane     Hand Dominance        Extremity/Trunk Assessment   Upper Extremity Assessment Upper Extremity Assessment: Defer to OT evaluation    Lower Extremity Assessment Lower Extremity Assessment: Generalized weakness    Cervical / Trunk Assessment Cervical / Trunk Assessment: Kyphotic  Communication   Communication: No difficulties  Cognition Arousal/Alertness: Awake/alert Behavior During Therapy: WFL for tasks assessed/performed Overall Cognitive Status: No family/caregiver present to determine baseline cognitive functioning Area of Impairment: Orientation;Memory;Safety/judgement                 Orientation Level: Disoriented to;Situation   Memory: Decreased short-term memory   Safety/Judgement: Decreased awareness of safety            General Comments  Exercises     Assessment/Plan    PT Assessment Patient needs continued PT services  PT Problem List Decreased strength;Decreased mobility;Decreased activity tolerance;Decreased balance;Decreased knowledge of use of DME;Decreased cognition;Decreased safety awareness       PT Treatment Interventions DME instruction;Gait training;Therapeutic  exercise;Therapeutic activities;Patient/family education;Balance training;Functional mobility training    PT Goals (Current goals can be found in the Care Plan section)  Acute Rehab PT Goals Patient Stated Goal: none stated PT Goal Formulation: With patient Time For Goal Achievement: 07/14/19 Potential to Achieve Goals: Good    Frequency Min 3X/week   Barriers to discharge Decreased caregiver support      Co-evaluation               AM-PAC PT "6 Clicks" Mobility  Outcome Measure Help needed turning from your back to your side while in a flat bed without using bedrails?: A Little Help needed moving from lying on your back to sitting on the side of a flat bed without using bedrails?: A Little Help needed moving to and from a bed to a chair (including a wheelchair)?: A Little Help needed standing up from a chair using your arms (e.g., wheelchair or bedside chair)?: A Little Help needed to walk in hospital room?: A Little Help needed climbing 3-5 steps with a railing? : A Little 6 Click Score: 18    End of Session Equipment Utilized During Treatment: Gait belt;Oxygen Activity Tolerance: Patient tolerated treatment well Patient left: in bed;with call bell/phone within reach;with bed alarm set   PT Visit Diagnosis: Unsteadiness on feet (R26.81);Difficulty in walking, not elsewhere classified (R26.2);History of falling (Z91.81)    Time: 1530-1550 PT Time Calculation (min) (ACUTE ONLY): 20 min   Charges:   PT Evaluation $PT Eval Moderate Complexity: 1 Mod            Rebeca Alert, PT Acute Rehabilitation Services Pager: [phone] Office: [phone]

## 2019-06-30 NOTE — Progress Notes (Signed)
SATURATION QUALIFICATIONS: (This note is used to comply with regulatory documentation for home oxygen)  Patient Saturations on Room Air at Rest = 80%  Patient Saturations on Room Air while Ambulating = 80%  Patient Saturations on 3 Liters of oxygen while Ambulating = 92%  Please briefly explain why patient needs home oxygen: Pt desats with any movement to low 80's. Quickly recovers with oxygen therapy at 3L [place].

## 2019-07-01 LAB — BASIC METABOLIC PANEL
Anion gap: 10 (ref 5–15)
BUN: 33 mg/dL — ABNORMAL HIGH (ref 8–23)
CO2: 32 mmol/L (ref 22–32)
Calcium: 9.2 mg/dL (ref 8.9–10.3)
Chloride: 97 mmol/L — ABNORMAL LOW (ref 98–111)
Creatinine, Ser: 0.8 mg/dL (ref 0.44–1.00)
GFR calc Af Amer: >60 mL/min (ref >60)
GFR calc non Af Amer: >60 mL/min (ref >60)
Glucose, Bld: 138 mg/dL — ABNORMAL HIGH (ref 70–99)
Potassium: 4 mmol/L (ref 3.5–5.1)
Sodium: 139 mmol/L (ref 135–145)

## 2019-07-01 LAB — CBC
HCT: 46.7 % — ABNORMAL HIGH (ref 36.0–46.0)
Hemoglobin: 15 g/dL (ref 12.0–15.0)
MCH: 30.4 pg (ref 26.0–34.0)
MCHC: 32.1 g/dL (ref 30.0–36.0)
MCV: 94.5 fL (ref 80.0–100.0)
Platelets: 213 10*3/uL (ref 150–400)
RBC: 4.94 MIL/uL (ref 3.87–5.11)
RDW: 14.8 % (ref 11.5–15.5)
WBC: 10.5 10*3/uL (ref 4.0–10.5)
nRBC: 0 % (ref 0.0–0.2)

## 2019-07-01 LAB — GLUCOSE, CAPILLARY: Glucose-Capillary: 123 mg/dL — ABNORMAL HIGH (ref 70–99)

## 2019-07-01 LAB — MAGNESIUM: Magnesium: 2.5 mg/dL — ABNORMAL HIGH (ref 1.7–2.4)

## 2019-07-01 MED ORDER — HALOPERIDOL LACTATE 5 MG/ML IJ SOLN
2.5000 mg | Freq: Once | INTRAMUSCULAR | Status: AC
  Administered 2019-07-01: 2.5 mg via INTRAVENOUS
  Filled 2019-07-01: qty 1

## 2019-07-01 MED ORDER — QUETIAPINE FUMARATE 25 MG PO TABS
25.0000 mg | ORAL_TABLET | Freq: Every day | ORAL | Status: DC
  Administered 2019-07-01: 21:00:00 25 mg via ORAL
  Filled 2019-07-01: qty 1

## 2019-07-01 MED ORDER — METHYLPREDNISOLONE SODIUM SUCC 125 MG IJ SOLR
60.0000 mg | Freq: Two times a day (BID) | INTRAMUSCULAR | Status: DC
  Administered 2019-07-01 – 2019-07-04 (×6): 60 mg via INTRAVENOUS
  Filled 2019-07-01 (×6): qty 2

## 2019-07-01 NOTE — Evaluation (Signed)
Occupational Therapy Evaluation Patient Details Name: Susan Landry MRN: [id number] DOB: [birth year] Today's Date: 07/01/2019    History of Present Illness 85 yo female admitted with respiratory failure. Hx of dementia, COPD, cerebral aneurysm   Clinical Impression   Pt admitted with Respiratory Failure. Pt currently with functional limitations due to the deficits listed below (see OT Problem List).  Pt will benefit from skilled OT to increase their safety and independence with ADL and functional mobility for ADL to facilitate discharge to venue listed below.   Pt lives in ILF but this day needs significant A - will likely need SNF. Daugther present and agreeable     Follow Up Recommendations  SNF          Precautions / Restrictions Precautions Precautions: Fall Precaution Comments: monitor O2 Restrictions Weight Bearing Restrictions: No      Mobility Bed Mobility Overal bed mobility: Needs Assistance Bed Mobility: Supine to Sit     Supine to sit: Supervision;HOB elevated     General bed mobility comments: for safety, lines  Transfers Overall transfer level: Needs assistance Equipment used: Rolling walker (2 wheeled) Transfers: Sit to/from Stand Sit to Stand: Min assist         General transfer comment: VCs safety, hand placement, proper operation of rollator    Balance Overall balance assessment: Needs assistance;History of Falls         Standing balance support: Bilateral upper extremity supported Standing balance-Leahy Scale: Poor                             ADL either performed or assessed with clinical judgement   ADL Overall ADL's : Needs assistance/impaired Eating/Feeding: Set up;Sitting   Grooming: Minimal assistance;Sitting           Upper Body Dressing : Minimal assistance;Sitting   Lower Body Dressing: Maximal assistance;Cueing for sequencing;Cueing for safety;Cueing for compensatory techniques   Toilet Transfer:  Maximal assistance;Stand-pivot;Cueing for sequencing;Cueing for safety   Toileting- Clothing Manipulation and Hygiene: Maximal assistance;Cueing for safety;Cueing for sequencing;Sit to/from stand         General ADL Comments: pt very confused. Daugther present.  Plan is back to [facility]- possibly SNF     Vision Patient Visual Report: No change from baseline              Pertinent Vitals/Pain Pain Assessment: No/denies pain     Hand Dominance     Extremity/Trunk Assessment Upper Extremity Assessment Upper Extremity Assessment: Generalized weakness       Cervical / Trunk Assessment Cervical / Trunk Assessment: Kyphotic   Communication Communication Communication: No difficulties   Cognition Arousal/Alertness: Awake/alert Behavior During Therapy: WFL for tasks assessed/performed Overall Cognitive Status: Impaired/Different from baseline Area of Impairment: Orientation;Memory;Safety/judgement                 Orientation Level: Disoriented to;Situation   Memory: Decreased short-term memory   Safety/Judgement: Decreased awareness of safety                    Home Living Family/patient expects to be discharged to:: Unsure Living Arrangements: Alone   Type of Home: Apartment Home Access: Stairs to enter Entrance Stairs-Number of Steps: 3   Home Layout: One level               Home Equipment: Walker - 4 wheels;Cane - single point          Prior Functioning/Environment Level  of Independence: Independent with assistive device(s)        Comments: uses RW vs cane        OT Problem List: Decreased strength;Decreased activity tolerance;Impaired balance (sitting and/or standing);Decreased safety awareness;Decreased knowledge of use of DME or AE;Decreased cognition      OT Treatment/Interventions: Self-care/ADL training;Therapeutic activities;Patient/family education    OT Goals(Current goals can be found in the care plan section) Acute  Rehab OT Goals Patient Stated Goal: none stated OT Goal Formulation: With patient Time For Goal Achievement: 07/08/19 Potential to Achieve Goals: Good ADL Goals Pt Will Perform Grooming: with supervision;standing Pt Will Perform Lower Body Dressing: with supervision;sit to/from stand Pt Will Transfer to Toilet: bedside commode;with min assist Pt Will Perform Toileting - Clothing Manipulation and hygiene: with min assist;sit to/from stand  OT Frequency: Min 2X/week              AM-PAC OT "6 Clicks" Daily Activity     Outcome Measure Help from another person eating meals?: A Little Help from another person taking care of personal grooming?: A Little Help from another person toileting, which includes using toliet, bedpan, or urinal?: A Lot Help from another person bathing (including washing, rinsing, drying)?: A Lot Help from another person to put on and taking off regular upper body clothing?: A Little Help from another person to put on and taking off regular lower body clothing?: A Lot 6 Click Score: 15   End of Session Equipment Utilized During Treatment: Rolling walker;Gait belt Nurse Communication: Mobility status  Activity Tolerance: Patient tolerated treatment well Patient left: in chair;with call bell/phone within reach;with chair alarm set  OT Visit Diagnosis: Unsteadiness on feet (R26.81);Other abnormalities of gait and mobility (R26.89)                Time: 3500-9381 OT Time Calculation (min): 13 min Charges:  OT General Charges $OT Visit: 1 Visit OT Evaluation $OT Eval Moderate Complexity: 1 Mod  Kari Baars, [place] [phone] Office- [phone]     Janise Gora, Edwena Felty D 07/01/2019, 3:01 PM

## 2019-07-01 NOTE — Progress Notes (Signed)
PROGRESS NOTE  Susan Landry PYP:[id number] DOB: 11-Mar-1928 DOA: 06/29/2019 PCP: Merlene Laughter, MD  Brief History   Susan Landry a 85 y.o.femalewith medical history significant forchronic leg swelling, mild cognitive impairment, presenting to the emergency department for evaluation of worsening dyspnea and low oxygen saturations. Patient reports a long history of exertional dyspnea but this has slowly worsened. She has a mildchroniccough but denies any fevers or chills. She reports chronic bilateral leg swelling that is unchanged. She denies orthopnea. She denies fevers or chills. She denies chest pain. She denies any smoking history but notes that she lived with heavy smokers, including her husband of 65 years. She hasbeen evaluated for this in the outpatient settings withoxygen saturations in the low 80s.She does not have home oxygen. She had a similar presentation in 2015, was suspected to have chronic obstructive lung disease, and was referred for PFTs, but the patient does not believe that this was ever performed.  ED Course:Upon arrival to the ED, patient is found to be afebrile, saturating mid to upper 80s on room air, tachypneic, and with stable blood pressure. EKG features a sinus rhythm. CTA chest is negative for PE, notable for large sliding-type hiatal hernia and minimal bibasilar atelectasis, but no edema, pneumonia, mass, or pleural effusions. Chemistry panel is notable for a bicarbonate of 36 and elevated BUN to creatinine ratio. CBC features a mild polycythemia. BNP and high-sensitivity troponin are normal. COVID-19 is negative. Patient was given 125 mg of IV Solu-Medrol, continuous albuterol neb, and placed on supplemental oxygen in the ED.  Consultants  . None  Procedures  . None  Antibiotics   Anti-infectives (From admission, onward)   None    .  Subjective  The patient is resting comfortably. No new complaints.  Objective   Vitals:   Vitals:   07/01/19 0545 07/01/19 1448  BP: 138/79 137/80  Pulse: 74 69  Resp: 17 18  Temp: 98.5 F (36.9 C) 98.4 F (36.9 C)  SpO2: (!) 83% 90%   Exam:  Constitutional:  . The patient is awake, alert, and oriented x 3. No acute distress. Respiratory:  . No increased work of breathing. . No wheezes, rales, or rhonchi . No tactile fremitus Cardiovascular:  . Regular rate and rhythm . No murmurs, ectopy, or gallups. . No lateral PMI. No thrills. Abdomen:  . Abdomen is soft, non-tender, non-distended . No hernias, masses, or organomegaly . Normoactive bowel sounds.  Musculoskeletal:  . No cyanosis, clubbing, or edema Skin:  . No rashes, lesions, ulcers . palpation of skin: no induration or nodules Neurologic:  . CN 2-12 intact . Sensation all 4 extremities intact Psychiatric:  . Mental status o Mood, affect appropriate o Orientation to person, place, time  . judgment and insight appear intact  I have personally reviewed the following:   Today's Data  . Vitals, BMP, CBC  Cardiology Data  . Echocardiogram: EF 60-65%. Inconclusive evidence for diastolic dysfunction. Dilated right atrial size. RVSP of 27.2.   Scheduled Meds: . donepezil  10 mg Oral QHS  . enoxaparin (LOVENOX) injection  40 mg Subcutaneous QHS  . furosemide  40 mg Oral BID  . potassium chloride  10 mEq Oral Daily  . predniSONE  40 mg Oral Q breakfast  . sodium chloride flush  3 mL Intravenous Q12H   Continuous Infusions: . sodium chloride      Principal Problem:   Respiratory failure with hypoxia (HCC) Active Problems:   MILD COGNITIVE IMPAIRMENT SO STATED  Bronchospasm   LOS: 1 day   A & P  Suspected underlying COPD: Patient presenting with progressive dyspnea and chronic nonproductive cough.  In the ED, patient was found to have an oxygen saturation of 80% on room air.  Chest x-ray negative for focal consolidation/effusion or vascular congestion.  CT angiogram chest negative for  pulmonary embolism.  P 74.0, high-sensitivity troponin III, unrevealing.  Afebrile without leukocytosis.The patient is receiving IV solumedrol, nebulizer treatments and supplemental O2.   Delirium: Confusion and agitation today per daughter. Likely a combination of New/different setting in a patient with dementia and the affect of steroids and beta agonists. Will start low dose seroquel for sleep.  Chronic lower extremity edema: No significant change in the amount of lower extremity edema, no orthopnea, BNP 74 within normal limits. Echocardiogram: Unrevealing. Likely due to venous stasis. Will try compression stockings.   Dementia: Continue Aricept  DVT prophylaxis: Lovenox Code Status: DNR. I have discussed code status with the patient's POA (daughter) Susan Landry. She has stated that the patient should be a DNR. Family Communication: I have discussed the patient in detail with the patient's POA, Susan Landry.  Disposition Plan: Continue inpatient hospitalization, awaiting therapy evaluation/input, will need home oxygen and possible SNF versus ALF placement.  Angeleah Labrake, DO [facility] Direct contact: see [email or web address]  7PM-7AM contact night coverage as above 07/01/2019, 4:29 PM  LOS: 1 day

## 2019-07-01 NOTE — TOC Initial Note (Signed)
Transition of Care [facility]) - Initial/Assessment Note    Patient Details  Name: Susan Landry MRN: [id number] Date of Birth: 1928/04/03  [facility]) CM/SW Contact:    Nila Nephew, LCSW Phone Number: [phone] 07/01/2019, 10:31 AM  Clinical Narrative:   Pt admitted with respiratory failure with hypoxia, charting indicates likely COPD. Pt lives at [place] independent living apartment, has aides who assist her in morning and evening and has been working with home therapy. Requiring o2 which is new for her. Per daughter, therapist mentioned last week about her beginning to desaturate when ambulating.  Pt typically oriented despite mild dementia per daughter, however in hospital has shown intermittent confusion (see below). Given that, new o2, and deconditioning with ambulation, daughter and pt agree that SNF would be desired upon DC.  CSW will initiate prior authorization from [facility] and is coordinating with [facility] SNF admissions.          Expected Discharge Plan: Skilled Nursing Facility Barriers to Discharge: Insurance Authorization   Patient Goals and CMS Choice Patient states their goals for this hospitalization and ongoing recovery are:: I am happy to be going back to [employer].gov Compare Post Acute Care list provided to:: Other (Comment Required)(daughter) Choice offered to / list presented to : Patient, Adult Children  Expected Discharge Plan and Services Expected Discharge Plan: [place] In-house Referral: Clinical Social Work   Post Acute Care Choice: [place] Living arrangements for the past 2 months: Apartment, [place]                  Prior Living Arrangements/Services Living arrangements for the past 2 months: [place], [place] Lives with:: Self, Facility Resident Patient language and need for interpreter reviewed:: Yes Do you feel safe going  back to the place where you live?: Yes      Need for Family Participation in Patient Care: Yes (Comment)(pt having some intermittent confusion) Care giver support system in place?: Yes (comment)(independent living resident) Current home services: DME, Homehealth aide, Home PT Criminal Activity/Legal Involvement Pertinent to Current Situation/Hospitalization: No - Comment as needed  Activities of Daily Living Home Assistive Devices/Equipment: Environmental consultant (specify type) ADL Screening (condition at time of admission) Patient's cognitive ability adequate to safely complete daily activities?: Yes Is the patient deaf or have difficulty hearing?: No Does the patient have difficulty seeing, even when wearing glasses/contacts?: No Does the patient have difficulty concentrating, remembering, or making decisions?: Yes Patient able to express need for assistance with ADLs?: Yes Does the patient have difficulty dressing or bathing?: Yes Independently performs ADLs?: No Does the patient have difficulty walking or climbing stairs?: Yes Weakness of Legs: None Weakness of Arms/Hands: None  Permission Sought/Granted Permission sought to share information with : Family Supports, Customer service manager Permission granted to share information with : Yes, Verbal Permission Granted  Share Information with NAME: [phone] daughter Lattie Haw  Permission granted to share info w AGENCY: [facility]        Emotional Assessment Appearance:: Appears stated age Attitude/Demeanor/Rapport: Engaged(pleasant, intermittently confused) Affect (typically observed): Accepting, Adaptable Orientation: : Fluctuating Orientation (Suspected and/or reported [place])(was oriented to name of hospital, town, details of living situation and medical history, then minutes later unable to identify her location, believed she was in hotel, speaking to [place] about "there are more than 12 kids in an elementary school classroom") Alcohol  / Substance Use: Not Applicable Psych Involvement: No (comment)  Admission diagnosis:  Hypoxia [R09.02]  COPD exacerbation (HCC) [J44.1] Patient Active Problem List   Diagnosis Date Noted  . Respiratory failure with hypoxia (HCC) 06/29/2019  . Pain due to onychomycosis of toenails of both feet 04/21/2019  . Diabetic neuropathy (HCC) 04/21/2019  . Angioedema 05/04/2014  . Acute diastolic heart failure (HCC) 10/06/2013  . Influenza with respiratory manifestations 10/06/2013  . Fall 10/03/2013  . Ear abrasion 10/03/2013  . Acute respiratory failure with hypoxia (HCC) 10/03/2013  . Bronchospasm 10/03/2013  . DOE (dyspnea on exertion) 06/11/2011  . MILD COGNITIVE IMPAIRMENT SO STATED 07/13/2009  . OVERACTIVE BLADDER 07/14/2008  . OSTEOARTHRITIS 01/12/2008  . HYPERTENSION 07/14/2007   PCP:  Merlene Laughter, MD Pharmacy:   [facility] [place], [place] - [place] [address] [place] [address] Phone: [phone] Fax: [phone]     Social Determinants of Health (SDOH) Interventions    Readmission Risk Interventions No flowsheet data found.

## 2019-07-02 DIAGNOSIS — R410 Disorientation, unspecified: Secondary | ICD-10-CM

## 2019-07-02 LAB — GLUCOSE, CAPILLARY: Glucose-Capillary: 122 mg/dL — ABNORMAL HIGH (ref 70–99)

## 2019-07-02 MED ORDER — HALOPERIDOL LACTATE 5 MG/ML IJ SOLN
2.5000 mg | Freq: Once | INTRAMUSCULAR | Status: AC
  Administered 2019-07-02: 2.5 mg via INTRAVENOUS
  Filled 2019-07-02: qty 1

## 2019-07-02 MED ORDER — QUETIAPINE FUMARATE 25 MG PO TABS: 12.5000 mg | ORAL_TABLET | Freq: Every day | ORAL | Status: DC

## 2019-07-02 NOTE — Progress Notes (Signed)
PT Cancellation Note  Patient Details Name: Susan Landry MRN: [id number] DOB: Aug 04, 1928   Cancelled Treatment:    Reason Eval/Treat Not Completed: Fatigue/lethargy limiting ability to participate. Pt napping. Nurse and nurse tech state pt didn't sleep at all last night or yesterday and requested PT allow pt to sleep. Will check back as schedule permits.   Galen Manila 07/02/2019, 11:32 AM

## 2019-07-02 NOTE — Care Management Important Message (Signed)
Important Message  Patient Details IM Letter given to Sharren Bridge SW to present to the Patient Name: ODEAN MCELWAIN MRN: [id number] Date of Birth: 10-29-1927   Medicare Important Message Given:  Yes     Kerin Salen 07/02/2019, 9:58 AM

## 2019-07-02 NOTE — Progress Notes (Signed)
PROGRESS NOTE  Susan Landry YQM:[id number] DOB: 1927/12/09 DOA: 06/29/2019 PCP: Merlene Laughter, MD  Brief History   Susan Landry a 85 y.o.femalewith medical history significant forchronic leg swelling, mild cognitive impairment, presenting to the emergency department for evaluation of worsening dyspnea and low oxygen saturations. Patient reports a long history of exertional dyspnea but this has slowly worsened. She has a mildchroniccough but denies any fevers or chills. She reports chronic bilateral leg swelling that is unchanged. She denies orthopnea. She denies fevers or chills. She denies chest pain. She denies any smoking history but notes that she lived with heavy smokers, including her husband of 65 years. She hasbeen evaluated for this in the outpatient settings withoxygen saturations in the low 80s.She does not have home oxygen. She had a similar presentation in 2015, was suspected to have chronic obstructive lung disease, and was referred for PFTs, but the patient does not believe that this was ever performed.  ED Course:Upon arrival to the ED, patient is found to be afebrile, saturating mid to upper 80s on room air, tachypneic, and with stable blood pressure. EKG features a sinus rhythm. CTA chest is negative for PE, notable for large sliding-type hiatal hernia and minimal bibasilar atelectasis, but no edema, pneumonia, mass, or pleural effusions. Chemistry panel is notable for a bicarbonate of 36 and elevated BUN to creatinine ratio. CBC features a mild polycythemia. BNP and high-sensitivity troponin are normal. COVID-19 is negative. Patient was given 125 mg of IV Solu-Medrol, continuous albuterol neb, and placed on supplemental oxygen in the ED.  The patient became quite delirious on 07/01/2019. She was given 25 mg Seroquel at bedtime. On 07/02/2019, the patient was somnolent most of the day. I have stopped it for this reason. Consultants  . None   Procedures  . None  Antibiotics   Anti-infectives (From admission, onward)   None    .   Subjective  The patient is somnolent, but rousable to stimulation. No new complaints.  Objective   Vitals:  Vitals:   07/02/19 0546 07/02/19 1257  BP: (!) 154/87 (!) 143/90  Pulse: 77 (!) 103  Resp: 18 18  Temp:  98.3 F (36.8 C)  SpO2: (!) 88% 92%   Exam: Constitutional:   The patient is somnolent, but rouseable. No acute distress. Respiratory:   No increased work of breathing.  Diminished breath sounds bilaterally.  No wheezes, rales, or rhonchi  No tactile fremitus Cardiovascular:   Regular rate and rhythm  No murmurs, ectopy, or gallups.  No lateral PMI. No thrills. Abdomen:   Abdomen is soft, non-tender, non-distended  No hernias, masses, or organomegaly  Normoactive bowel sounds.  Musculoskeletal:   No cyanosis, clubbing, or edema Skin:   No rashes, lesions, ulcers  palpation of skin: no induration or nodules Neurologic:   CN 2-12 intact  Sensation all 4 extremities intact Psychiatric:   Pt too somnolent to participate in examination. I have personally reviewed the following:   Today's Data  . Vitals  Scheduled Meds: . donepezil  10 mg Oral QHS  . enoxaparin (LOVENOX) injection  40 mg Subcutaneous QHS  . furosemide  40 mg Oral BID  . methylPREDNISolone (SOLU-MEDROL) injection  60 mg Intravenous Q12H  . potassium chloride  10 mEq Oral Daily  . QUEtiapine  12.5 mg Oral QHS  . sodium chloride flush  3 mL Intravenous Q12H   Continuous Infusions: . sodium chloride      Principal Problem:   Respiratory failure with hypoxia (HCC)  Active Problems:   MILD COGNITIVE IMPAIRMENT SO STATED   Bronchospasm   LOS: 2 days   A & P  Suspected underlying COPD: Patient presenting with progressive dyspnea and chronic nonproductive cough. In the ED, patient was found to have an oxygen saturation of 80% on room air. Chest x-ray negative for focal  consolidation/effusion or vascular congestion. CT angiogram chest negative for pulmonary embolism. P 74.0, high-sensitivity troponin III, unrevealing. Afebrile without leukocytosis.The patient is receiving IV solumedrol, nebulizer treatments and supplemental O2. She is currently requiring 3L O2 by nasal cannula to maintain SaO2 in the low to mid nineties.  Delirium: Confusion and agitation today per daughter. Likely a combination of New/different setting in a patient with dementia and the affect of steroids and beta agonists. Low dose seroquel started for sleep, but this has left the patient very sleepy today. Will stop.  Chronic lower extremity edema: No significant change in the amount of lower extremity edema, no orthopnea, BNP 74 within normal limits. Echocardiogram: Unrevealing. Likely due to venous stasis. Will try compression stockings.   Dementia: Continue Aricept  I have seen and examined this patient myself. I have spent 34 minutes in her evaluation and care.  DVT prophylaxis:Lovenox Code Status:DNR. I have discussed code status with the patient's POA (daughter) Susan Landry. She has stated that the patient should be a DNR. Family Communication:INone available today. Disposition Plan:Continue inpatient hospitalization, awaiting therapy evaluation/input, will need home oxygen and possible SNF versus ALF placement.   Annaleese Guier, DO [facility] Direct contact: see [email or web address]  7PM-7AM contact night coverage as above 07/02/2019, 5:50 PM  LOS: 2 days

## 2019-07-02 NOTE — TOC Progression Note (Signed)
Transition of Care [facility]) - Progression Note    Patient Details  Name: Susan Landry MRN: [id number] Date of Birth: [birth year]  Transition of Care [facility]) CM/SW [place], [place] Phone Number: [phone] 07/02/2019, 11:27 AM  Clinical Narrative:   Pt received approval for SNF admission at [facility] - authorization details provided to facility.   Expected Discharge Plan: Skilled Nursing Facility Barriers to Discharge: Barriers Resolved  Expected Discharge Plan and Services Expected Discharge Plan: [place] In-house Referral: Clinical Social Work   Post Acute Care Choice: [place] Living arrangements for the past 2 months: Apartment, [place]                      Social Determinants of Health (SDOH) Interventions    Readmission Risk Interventions No flowsheet data found.

## 2019-07-03 LAB — GLUCOSE, CAPILLARY: Glucose-Capillary: 106 mg/dL — ABNORMAL HIGH (ref 70–99)

## 2019-07-03 MED ORDER — LIP MEDEX EX OINT
TOPICAL_OINTMENT | CUTANEOUS | Status: AC
  Administered 2019-07-03: 13:00:00
  Filled 2019-07-03: qty 7

## 2019-07-03 NOTE — Progress Notes (Signed)
Patient too lethargic tonight to attempt ambulatory pulse ox. Will try again in am. Susan Landry

## 2019-07-03 NOTE — Progress Notes (Signed)
PROGRESS NOTE  Susan Landry WOE:[id number] DOB: Jan 29, 1928 DOA: 06/29/2019 PCP: Merlene Laughter, MD  Brief History   Susan Landry a 85 y.o.femalewith medical history significant forchronic leg swelling, mild cognitive impairment, presenting to the emergency department for evaluation of worsening dyspnea and low oxygen saturations. Patient reports a long history of exertional dyspnea but this has slowly worsened. She has a mildchroniccough but denies any fevers or chills. She reports chronic bilateral leg swelling that is unchanged. She denies orthopnea. She denies fevers or chills. She denies chest pain. She denies any smoking history but notes that she lived with heavy smokers, including her husband of 65 years. She hasbeen evaluated for this in the outpatient settings withoxygen saturations in the low 80s.She does not have home oxygen. She had a similar presentation in 2015, was suspected to have chronic obstructive lung disease, and was referred for PFTs, but the patient does not believe that this was ever performed.  ED Course:Upon arrival to the ED, patient is found to be afebrile, saturating mid to upper 80s on room air, tachypneic, and with stable blood pressure. EKG features a sinus rhythm. CTA chest is negative for PE, notable for large sliding-type hiatal hernia and minimal bibasilar atelectasis, but no edema, pneumonia, mass, or pleural effusions. Chemistry panel is notable for a bicarbonate of 36 and elevated BUN to creatinine ratio. CBC features a mild polycythemia. BNP and high-sensitivity troponin are normal. COVID-19 is negative. Patient was given 125 mg of IV Solu-Medrol, continuous albuterol neb, and placed on supplemental oxygen in the ED.  The patient became quite delirious on 07/01/2019. She was given 25 mg Seroquel at bedtime. On 07/02/2019, the patient was somnolent most of the day. I have stopped it for this reason.  Mentation was improved on  07/03/2019. Consultants   None  Procedures   None  Antibiotics   Anti-infectives (From admission, onward)   None      Subjective  The patient is resting comfortably. No new complaints.  Objective   Vitals:  Vitals:   07/03/19 0554 07/03/19 1237  BP: (!) 146/70 138/88  Pulse: 75 (!) 104  Resp: 17 16  Temp: 97.7 F (36.5 C) 97.7 F (36.5 C)  SpO2: 90% 92%   Exam: Constitutional:   The patient is awake, alert, and oriented x 3. No acute distress. Respiratory:   No increased work of breathing.  Diminished breath sounds bilaterally.  No wheezes, rales, or rhonchi  No tactile fremitus Cardiovascular:   Regular rate and rhythm  No murmurs, ectopy, or gallups.  No lateral PMI. No thrills. Abdomen:   Abdomen is soft, non-tender, non-distended  No hernias, masses, or organomegaly  Normoactive bowel sounds.  Musculoskeletal:   No cyanosis, clubbing, or edema Skin:   No rashes, lesions, ulcers  palpation of skin: no induration or nodules Neurologic:   CN 2-12 intact  Sensation all 4 extremities intact Psychiatric:   Mood and affect are congruent. I have personally reviewed the following:   Today's Data   Vitals  Scheduled Meds:  donepezil  10 mg Oral QHS   enoxaparin (LOVENOX) injection  40 mg Subcutaneous QHS   furosemide  40 mg Oral BID   methylPREDNISolone (SOLU-MEDROL) injection  60 mg Intravenous Q12H   potassium chloride  10 mEq Oral Daily   sodium chloride flush  3 mL Intravenous Q12H   Continuous Infusions:  sodium chloride      Principal Problem:   Respiratory failure with hypoxia (HCC) Active Problems:  MILD COGNITIVE IMPAIRMENT SO STATED   Bronchospasm   LOS: 3 days   A & P  Suspected underlying COPD: Patient presenting with progressive dyspnea and chronic nonproductive cough. In the ED, patient was found to have an oxygen saturation of 80% on room air. Chest x-ray negative for focal consolidation/effusion  or vascular congestion. CT angiogram chest negative for pulmonary embolism. P 74.0, high-sensitivity troponin III, unrevealing. Afebrile without leukocytosis.The patient is receiving IV solumedrol, nebulizer treatments and supplemental O2. She is currently requiring 3L O2 by nasal cannula to maintain SaO2 in the low to mid nineties.  Acute hypoxic respiratory failure: Consistent supplemental oxygen needs of 2-3 liters. Will ambulate patient and Evaluate whether or not the patient will need O2 for home use and how much.   Delirium: Resolved. Mobilize to continue to improve mentation. Confusion and agitation today per daughter. Likely a combination of New/different setting in a patient with dementia and the affect of steroids and beta agonists. Low dose seroquel started for sleep, but this has left the patient very sleepy today. Will stop.  Chronic lower extremity edema: No significant change in the amount of lower extremity edema, no orthopnea, BNP 74 within normal limits. Echocardiogram: Unrevealing. Likely due to venous stasis. Will try compression stockings.   Dementia: Continue Aricept  I have seen and examined this patient myself. I have spent 36 minutes in her evaluation and care.  DVT prophylaxis:Lovenox Code Status:DNR. I have discussed code status with the patient's POA (daughter) Susan Landry. She has stated that the patient should be a DNR. Family Communication:Daughter is at bedside. All questions answered to the best of my abilities. Disposition Plan:Continue inpatient hospitalization, awaiting therapy evaluation/input, will need home oxygen and possible SNF versus ALF placement.   Susan Kronick, DO [facility] Direct contact: see [email or web address]  7PM-7AM contact night coverage as above 07/03/2019, 2:17 PM  LOS: 2 days

## 2019-07-04 LAB — CBC WITH DIFFERENTIAL/PLATELET
Abs Immature Granulocytes: 0.02 10*3/uL (ref 0.00–0.07)
Basophils Absolute: 0 10*3/uL (ref 0.0–0.1)
Basophils Relative: 0 %
Eosinophils Absolute: 0 10*3/uL (ref 0.0–0.5)
Eosinophils Relative: 0 %
HCT: 53.3 % — ABNORMAL HIGH (ref 36.0–46.0)
Hemoglobin: 16.5 g/dL — ABNORMAL HIGH (ref 12.0–15.0)
Immature Granulocytes: 0 %
Lymphocytes Relative: 6 %
Lymphs Abs: 0.5 10*3/uL — ABNORMAL LOW (ref 0.7–4.0)
MCH: 29.6 pg (ref 26.0–34.0)
MCHC: 31 g/dL (ref 30.0–36.0)
MCV: 95.7 fL (ref 80.0–100.0)
Monocytes Absolute: 0.4 10*3/uL (ref 0.1–1.0)
Monocytes Relative: 5 %
Neutro Abs: 7.4 10*3/uL (ref 1.7–7.7)
Neutrophils Relative %: 89 %
Platelets: 232 10*3/uL (ref 150–400)
RBC: 5.57 MIL/uL — ABNORMAL HIGH (ref 3.87–5.11)
RDW: 15 % (ref 11.5–15.5)
WBC: 8.2 10*3/uL (ref 4.0–10.5)
nRBC: 0 % (ref 0.0–0.2)

## 2019-07-04 LAB — BASIC METABOLIC PANEL
Anion gap: 10 (ref 5–15)
BUN: 41 mg/dL — ABNORMAL HIGH (ref 8–23)
CO2: 37 mmol/L — ABNORMAL HIGH (ref 22–32)
Calcium: 8.8 mg/dL — ABNORMAL LOW (ref 8.9–10.3)
Chloride: 96 mmol/L — ABNORMAL LOW (ref 98–111)
Creatinine, Ser: 0.84 mg/dL (ref 0.44–1.00)
GFR calc Af Amer: >60 mL/min (ref >60)
GFR calc non Af Amer: >60 mL/min (ref >60)
Glucose, Bld: 131 mg/dL — ABNORMAL HIGH (ref 70–99)
Potassium: 5.5 mmol/L — ABNORMAL HIGH (ref 3.5–5.1)
Sodium: 143 mmol/L (ref 135–145)

## 2019-07-04 LAB — GLUCOSE, CAPILLARY: Glucose-Capillary: 117 mg/dL — ABNORMAL HIGH (ref 70–99)

## 2019-07-04 MED ORDER — SODIUM ZIRCONIUM CYCLOSILICATE 5 G PO PACK
5.0000 g | PACK | Freq: Once | ORAL | Status: AC
  Administered 2019-07-04: 20:00:00 5 g via ORAL
  Filled 2019-07-04: qty 1

## 2019-07-04 MED ORDER — PREDNISONE 20 MG PO TABS
40.0000 mg | ORAL_TABLET | Freq: Two times a day (BID) | ORAL | Status: DC
  Administered 2019-07-04 – 2019-07-05 (×3): 40 mg via ORAL
  Filled 2019-07-04 (×3): qty 2

## 2019-07-04 NOTE — Progress Notes (Signed)
PROGRESS NOTE  Susan Landry INO:[id number] DOB: 1928-01-20 DOA: 06/29/2019 PCP: Merlene Laughter, MD  Brief History   Jetty Berland Midgettis a 85 y.o.femalewith medical history significant forchronic leg swelling, mild cognitive impairment, presenting to the emergency department for evaluation of worsening dyspnea and low oxygen saturations. Patient reports a long history of exertional dyspnea but this has slowly worsened. She has a mildchroniccough but denies any fevers or chills. She reports chronic bilateral leg swelling that is unchanged. She denies orthopnea. She denies fevers or chills. She denies chest pain. She denies any smoking history but notes that she lived with heavy smokers, including her husband of 65 years. She hasbeen evaluated for this in the outpatient settings withoxygen saturations in the low 80s.She does not have home oxygen. She had a similar presentation in 2015, was suspected to have chronic obstructive lung disease, and was referred for PFTs, but the patient does not believe that this was ever performed.  ED Course:Upon arrival to the ED, patient is found to be afebrile, saturating mid to upper 80s on room air, tachypneic, and with stable blood pressure. EKG features a sinus rhythm. CTA chest is negative for PE, notable for large sliding-type hiatal hernia and minimal bibasilar atelectasis, but no edema, pneumonia, mass, or pleural effusions. Chemistry panel is notable for a bicarbonate of 36 and elevated BUN to creatinine ratio. CBC features a mild polycythemia. BNP and high-sensitivity troponin are normal. COVID-19 is negative. Patient was given 125 mg of IV Solu-Medrol, continuous albuterol neb, and placed on supplemental oxygen in the ED.  The patient became quite delirious on 07/01/2019. She was given 25 mg Seroquel at bedtime. On 07/02/2019, the patient was somnolent most of the day. I have stopped it for this reason.  Mentation was improved on  07/03/2019. Consultants   None  Procedures   None  Antibiotics   Anti-infectives (From admission, onward)   None      Subjective  The patient is resting comfortably. No new complaints.  Objective   Vitals:  Vitals:   07/04/19 0610 07/04/19 1336  BP: 138/85 130/67  Pulse: 65 81  Resp: 18 16  Temp: 97.8 F (36.6 C) (!) 97.5 F (36.4 C)  SpO2: 92% 98%   Exam: Constitutional:   The patient is awake, alert, and oriented x 3. No acute distress. Daughter is at bedside. Respiratory:   No increased work of breathing.  Diminished breath sounds bilaterally.  No wheezes, rales, or rhonchi  No tactile fremitus Cardiovascular:   Regular rate and rhythm  No murmurs, ectopy, or gallups.  No lateral PMI. No thrills. Abdomen:   Abdomen is soft, non-tender, non-distended  No hernias, masses, or organomegaly  Normoactive bowel sounds.  Musculoskeletal:   No cyanosis, clubbing, or edema Skin:   No rashes, lesions, ulcers  palpation of skin: no induration or nodules Neurologic:   CN 2-12 intact  Sensation all 4 extremities intact Psychiatric:   Mood and affect are congruent. I have personally reviewed the following:   Today's Data   Vitals, CBC, BMP  Scheduled Meds:  donepezil  10 mg Oral QHS   enoxaparin (LOVENOX) injection  40 mg Subcutaneous QHS   furosemide  40 mg Oral BID   predniSONE  40 mg Oral BID WC   sodium chloride flush  3 mL Intravenous Q12H   Continuous Infusions:  sodium chloride      Principal Problem:   Respiratory failure with hypoxia (HCC) Active Problems:   MILD COGNITIVE IMPAIRMENT SO STATED  Bronchospasm   LOS: 4 days   A & P  Suspected underlying COPD: Patient presenting with progressive dyspnea and chronic nonproductive cough. In the ED, patient was found to have an oxygen saturation of 80% on room air. Chest x-ray negative for focal consolidation/effusion or vascular congestion. CT angiogram chest  negative for pulmonary embolism. P 74.0, high-sensitivity troponin III, unrevealing. Afebrile without leukocytosis.The patient is receiving IV solumedrol, nebulizer treatments and supplemental O2. She is currently requiring 4L O2 by nasal cannula to maintain SaO2 in the mid to high nineties.  Acute hypoxic respiratory failure: Consistent supplemental oxygen needs of 2-4 liters. Will ambulate patient and Evaluate whether or not the patient will need O2 for home use and how much.   Delirium: Resolved. Mobilize to continue to improve mentation. Confusion and agitation today per daughter. Likely a combination of New/different setting in a patient with dementia and the affect of steroids and beta agonists. Low dose seroquel started for sleep, but this has left the patient very sleepy today. Will stop.  Chronic lower extremity edema: No significant change in the amount of lower extremity edema, no orthopnea, BNP 74 within normal limits. Echocardiogram: Unrevealing. Likely due to venous stasis. Will try compression stockings.   Dementia: Continue Aricept  I have seen and examined this patient myself. I have spent 36 minutes in her evaluation and care.  DVT prophylaxis:Lovenox Code Status:DNR. I have discussed code status with the patient's POA (daughter) Vaughan Basta. She has stated that the patient should be a DNR. Family Communication:Daughter is at bedside. All questions answered to the best of my abilities. Disposition Plan:Continue inpatient hospitalization, awaiting therapy evaluation/input, will need home oxygen and possible SNF versus ALF placement.  Maimouna Rondeau, DO [facility] Direct contact: see [email or web address]  7PM-7AM contact night coverage as above 07/04/2019, 3:59 PM  LOS: 2 days

## 2019-07-05 DIAGNOSIS — J9601 Acute respiratory failure with hypoxia: Principal | ICD-10-CM

## 2019-07-05 LAB — BASIC METABOLIC PANEL
Anion gap: 11 (ref 5–15)
BUN: 36 mg/dL — ABNORMAL HIGH (ref 8–23)
CO2: 36 mmol/L — ABNORMAL HIGH (ref 22–32)
Calcium: 8.8 mg/dL — ABNORMAL LOW (ref 8.9–10.3)
Chloride: 95 mmol/L — ABNORMAL LOW (ref 98–111)
Creatinine, Ser: 0.77 mg/dL (ref 0.44–1.00)
GFR calc Af Amer: >60 mL/min (ref >60)
GFR calc non Af Amer: >60 mL/min (ref >60)
Glucose, Bld: 120 mg/dL — ABNORMAL HIGH (ref 70–99)
Potassium: 3.9 mmol/L (ref 3.5–5.1)
Sodium: 142 mmol/L (ref 135–145)

## 2019-07-05 LAB — CBC WITH DIFFERENTIAL/PLATELET
Abs Immature Granulocytes: 0.04 10*3/uL (ref 0.00–0.07)
Basophils Absolute: 0 10*3/uL (ref 0.0–0.1)
Basophils Relative: 0 %
Eosinophils Absolute: 0 10*3/uL (ref 0.0–0.5)
Eosinophils Relative: 0 %
HCT: 51.9 % — ABNORMAL HIGH (ref 36.0–46.0)
Hemoglobin: 16.2 g/dL — ABNORMAL HIGH (ref 12.0–15.0)
Immature Granulocytes: 0 %
Lymphocytes Relative: 7 %
Lymphs Abs: 0.7 10*3/uL (ref 0.7–4.0)
MCH: 29.8 pg (ref 26.0–34.0)
MCHC: 31.2 g/dL (ref 30.0–36.0)
MCV: 95.6 fL (ref 80.0–100.0)
Monocytes Absolute: 0.5 10*3/uL (ref 0.1–1.0)
Monocytes Relative: 6 %
Neutro Abs: 8.2 10*3/uL — ABNORMAL HIGH (ref 1.7–7.7)
Neutrophils Relative %: 87 %
Platelets: 198 10*3/uL (ref 150–400)
RBC: 5.43 MIL/uL — ABNORMAL HIGH (ref 3.87–5.11)
RDW: 14.7 % (ref 11.5–15.5)
WBC: 9.5 10*3/uL (ref 4.0–10.5)
nRBC: 0 % (ref 0.0–0.2)

## 2019-07-05 LAB — SARS CORONAVIRUS 2 (TAT 6-24 HRS): SARS Coronavirus 2: NEGATIVE

## 2019-07-05 LAB — GLUCOSE, CAPILLARY: Glucose-Capillary: 86 mg/dL (ref 70–99)

## 2019-07-05 MED ORDER — PREDNISONE 20 MG PO TABS
40.0000 mg | ORAL_TABLET | Freq: Every day | ORAL | Status: DC
  Administered 2019-07-06: 08:00:00 40 mg via ORAL
  Filled 2019-07-05: qty 2

## 2019-07-05 NOTE — TOC Progression Note (Signed)
Transition of Care [facility]) - Progression Note    Patient Details  Name: ATHENE SCHUHMACHER MRN: [id number] Date of Birth: 1927/10/21  [facility]) CM/SW Contact  Joaquin Courts, RN Phone Number: 07/05/2019, 9:58 AM  Clinical Narrative:    CM spoke with [facility] rep Lindajo Royal who reports a bed is available and is being held for patient. Josem Kaufmann is in place through 07/06/19. Of note, patient's Covid test is out of date, facility requires negative Covid test within 48 hours of discharge. MD notified of this, new Covid test to be ordered. Facility ready to accept patient as soon as Covid test results.    Expected Discharge Plan: Skilled Nursing Facility Barriers to Discharge: Barriers Resolved  Expected Discharge Plan and Services Expected Discharge Plan: [place] In-house Referral: Clinical Social Work   Post Acute Care Choice: [place] Living arrangements for the past 2 months: Apartment, [place]                                       Social Determinants of Health (SDOH) Interventions    Readmission Risk Interventions No flowsheet data found.

## 2019-07-05 NOTE — Care Management Important Message (Signed)
Important Message  Patient Details IM Letter given to Nancy Marus RN to present to the Patient Name: Susan Landry MRN: [id number] Date of Birth: 1927-12-20   Medicare Important Message Given:  Yes     Kerin Salen 07/05/2019, 9:14 AM

## 2019-07-05 NOTE — Progress Notes (Signed)
Physical Therapy Treatment Patient Details Name: Susan Landry MRN: [id number] DOB: Nov 22, 1927 Today's Date: 07/05/2019    History of Present Illness 85 yo female admitted with respiratory failure. Hx of dementia, COPD, cerebral aneurysm    PT Comments    O2: 87% on RA at rest, 93% on 4L [place] O2. Pt is weaker than she was on evaluation. She stated she hasn't been OOB since then. Recommend daily OOB to chair with nursing assistance. Will need SNF.    Follow Up Recommendations  SNF     Equipment Recommendations  None recommended by PT    Recommendations for Other Services       Precautions / Restrictions Precautions Precautions: Fall Precaution Comments: monitor O2 Restrictions Weight Bearing Restrictions: No    Mobility  Bed Mobility Overal bed mobility: Needs Assistance Bed Mobility: Supine to Sit     Supine to sit: Mod assist;HOB elevated     General bed mobility comments: Assst for trunk and scoot to EOB. Increased time.  Transfers Overall transfer level: Needs assistance Equipment used: Rolling walker (2 wheeled) Transfers: Sit to/from Stand Sit to Stand: Min assist         General transfer comment: VCs safety, hand placement. Assist to rise, steady, control descent.  Ambulation/Gait Ambulation/Gait assistance: Min assist Gait Distance (Feet): 75 Feet Assistive device: Rolling walker (2 wheeled) Gait Pattern/deviations: Step-through pattern;Trunk flexed;Decreased stride length;Narrow base of support;Scissoring     General Gait Details: VCs safety, distance from RW. Assist to stabilize throughout distances. O2: 93 on 4L [place]. Dyspnea 2/4.   Stairs             Wheelchair Mobility    Modified Rankin (Stroke Patients Only)       Balance Overall balance assessment: Needs assistance         Standing balance support: Bilateral upper extremity supported Standing balance-Leahy Scale: Poor                               Cognition Arousal/Alertness: Awake/alert Behavior During Therapy: WFL for tasks assessed/performed Overall Cognitive Status: History of cognitive impairments - at baseline                                 General Comments: but mostly WFL on today      Exercises      General Comments        Pertinent Vitals/Pain Pain Assessment: No/denies pain    Home Living                      Prior Function            PT Goals (current goals can now be found in the care plan section) Progress towards PT goals: Progressing toward goals    Frequency    Min 3X/week      PT Plan Current plan remains appropriate    Co-evaluation              AM-PAC PT "6 Clicks" Mobility   Outcome Measure  Help needed turning from your back to your side while in a flat bed without using bedrails?: A Little Help needed moving from lying on your back to sitting on the side of a flat bed without using bedrails?: A Lot Help needed moving to and from a bed to a chair (including a wheelchair)?: A  Little Help needed standing up from a chair using your arms (e.g., wheelchair or bedside chair)?: A Little Help needed to walk in hospital room?: A Little Help needed climbing 3-5 steps with a railing? : A Lot 6 Click Score: 16    End of Session Equipment Utilized During Treatment: Gait belt;Oxygen   Patient left: in chair;with call bell/phone within reach;with chair alarm set   PT Visit Diagnosis: Unsteadiness on feet (R26.81);Difficulty in walking, not elsewhere classified (R26.2);History of falling (Z91.81);Muscle weakness (generalized) (M62.81)     Time: 3818-2993 PT Time Calculation (min) (ACUTE ONLY): 34 min  Charges:  $Gait Training: 23-37 mins                        Weston Anna, PT Acute Rehabilitation Services Pager: [phone] Office: [phone]

## 2019-07-05 NOTE — Progress Notes (Signed)
PROGRESS NOTE    Susan Landry  KNL:[id number] DOB: 06-12-1928 DOA: 06/29/2019 PCP: Lajean Manes, MD    Brief Narrative:   Susan Binion Midgettis a 85 y.o.femalewith medical history significant forchronic leg swelling, mild cognitive impairment, presenting to the emergency department for evaluation of worsening dyspnea and low oxygen saturations. Patient reports a long history of exertional dyspnea but this has slowly worsened. She has a mildchroniccough but denies any fevers or chills. She reports chronic bilateral leg swelling that is unchanged. She denies orthopnea. She denies fevers or chills. She denies chest pain. She denies any smoking history but notes that she lived with heavy smokers, including her husband of 64 years. She hasbeen evaluated for this in the outpatient settings withoxygen saturations in the low 80s.She does not have home oxygen. She had a similar presentation in 2015, was suspected to have chronic obstructive lung disease, and was referred for PFTs, but the patient does not believe that this was ever performed.  ED Course:Upon arrival to the ED, patient is found to be afebrile, saturating mid to upper 80s on room air, tachypneic, and with stable blood pressure. EKG features a sinus rhythm. CTA chest is negative for PE, notable for large sliding-type hiatal hernia and minimal bibasilar atelectasis, but no edema, pneumonia, mass, or pleural effusions. Chemistry panel is notable for a bicarbonate of 36 and elevated BUN to creatinine ratio. CBC features a mild polycythemia. BNP and high-sensitivity troponin are normal. COVID-19 is negative. Patient was given 125 mg of IV Solu-Medrol, continuous albuterol neb, and placed on supplemental oxygen in the ED.   Assessment & Plan:   Principal Problem:   Respiratory failure with hypoxia (HCC) Active Problems:   MILD COGNITIVE IMPAIRMENT SO STATED   Bronchospasm   Acute hypoxic respiratory failure  Suspected underlying COPD Patient presenting with progressive dyspnea and chronic nonproductive cough.  In the ED, patient was found to have an oxygen saturation of 80% on room air.  Chest x-ray negative for focal consolidation/effusion or vascular congestion.  CT angiogram chest negative for pulmonary embolism.   high-sensitivity troponin 3, unrevealing.  Afebrile without leukocytosis.  Echocardiogram with preserved EF. --start to taper prednsione --continue nebs prn --Continue supplemental oxygen, titrate for SPO2 greater than 88%  Chronic lower extremity edema No significant change in the amount of lower extremity edema, no orthopnea, BNP 74 within normal limits.  Echocardiogram with preserved EF, indeterminate pattern of LV diastolic filling. --Continue home furosemide 40 mg p.o. twice daily  Dementia: Continue Aricept   DVT prophylaxis: Lovenox Code Status: Full code Family Communication: None present at bedside Disposition Plan:  Pending SN[facility]   Consultants:   None  Procedures:  Echocardiogram:  IMPRESSIONS    1. Left ventricular ejection fraction, by visual estimation, is 60 to 65%. The left ventricle has normal function. Normal left ventricular size. There is no left ventricular hypertrophy.  2. Left ventricular diastolic Doppler parameters are indeterminate pattern of LV diastolic filling.  3. Global right ventricle has normal systolic function.The right ventricular size is normal. No increase in right ventricular wall thickness.  4. Left atrial size was normal.  5. Right atrial size was mildly dilated.  6. Trivial pericardial effusion is present.  7. The mitral valve is normal in structure. No evidence of mitral valve regurgitation. No evidence of mitral stenosis.  8. The tricuspid valve is normal in structure. Tricuspid valve regurgitation is trivial.  9. The aortic valve is normal in structure. Aortic valve regurgitation  was not  visualized by color flow Doppler. Structurally normal aortic valve, with no evidence of sclerosis or stenosis. 10. The pulmonic valve was normal in structure. Pulmonic valve regurgitation is not visualized by color flow Doppler. 11. Normal pulmonary artery systolic pressure. 12. The inferior vena cava is normal in size with greater than 50% respiratory variability, suggesting right atrial pressure of 3 mmHg.  Antimicrobials:   None    Subjective: Patient seen and examined at bedside, resting comfortably.  No complaints this morning.  Awaiting SNF placement.  Denies headache, no fever/chills/night sweats, no nausea some vomiting/diarrhea, no chest pain, no abdominal pain, no fatigue.  No concerns overnight per nursing staff.  Objective: Vitals:   07/04/19 1336 07/04/19 2017 07/05/19 0558 07/05/19 1334  BP: 130/67 (!) 136/94 128/77 134/78  Pulse: 81 83 (!) 57 89  Resp: 16 20 20    Temp: (!) 97.5 F (36.4 C) 98.5 F (36.9 C) 97.8 F (36.6 C) 97.9 F (36.6 C)  TempSrc: Oral Oral Oral Oral  SpO2: 98% 90% 92% 100%  Weight:      Height:        Intake/Output Summary (Last 24 hours) at 07/05/2019 1706 Last data filed at 07/05/2019 1300 Gross per 24 hour  Intake 720 ml  Output 1500 ml  Net -780 ml   Filed Weights   06/29/19 2223 06/30/19 0500 07/03/19 0500  Weight: 74 kg 74 kg 80.3 kg    Examination:  General exam: Appears calm and comfortable  Respiratory system: Breath sounds slightly decreased bilateral bases, no wheezing/crackles, normal respiratory effort, on 3 L nasal cannula oxygenating 93%. Cardiovascular system: S1 & S2 heard, RRR. No JVD, murmurs, rubs, gallops or clicks. No pedal edema. Gastrointestinal system: Abdomen is nondistended, soft and nontender. No organomegaly or masses felt. Normal bowel sounds heard. Central nervous system: Alert and oriented. No focal neurological deficits. Extremities: Symmetric 5 x 5 power. Skin: No rashes, lesions or ulcers  Psychiatry: Judgement and insight appear normal. Mood & affect appropriate.     Data Reviewed: I have personally reviewed following labs and imaging studies  CBC: Recent Labs  Lab 06/29/19 1138 06/30/19 0530 07/01/19 0538 07/04/19 0536 07/05/19 0531  WBC 7.1 8.3 10.5 8.2 9.5  NEUTROABS 5.1  --   --  7.4 8.2*  HGB 15.3* 14.3 15.0 16.5* 16.2*  HCT 48.4* 45.8 46.7* 53.3* 51.9*  MCV 94.7 96.0 94.5 95.7 95.6  PLT 226 211 213 232 198   Basic Metabolic Panel: Recent Labs  Lab 06/29/19 1138 06/30/19 0530 07/01/19 0538 07/04/19 0536 07/05/19 0531  NA 142 138 139 143 142  K 3.9 3.8 4.0 5.5* 3.9  CL 97* 96* 97* 96* 95*  CO2 36* 30 32 37* 36*  GLUCOSE 91 163* 138* 131* 120*  BUN 25* 23 33* 41* 36*  CREATININE 0.87 0.77 0.80 0.84 0.77  CALCIUM 8.9 8.8* 9.2 8.8* 8.8*  MG  --   --  2.5*  --   --    GFR: Estimated Creatinine Clearance: 44.7 mL/min (by C-G formula based on SCr of 0.77 mg/dL). Liver Function Tests: Recent Labs  Lab 06/29/19 1138  AST 18  ALT 20  ALKPHOS 106  BILITOT 0.8  PROT 7.0  ALBUMIN 3.8   No results for input(s): LIPASE, AMYLASE in the last 168 hours. No results for input(s): AMMONIA in the last 168 hours. Coagulation Profile: Recent Labs  Lab 06/29/19 1138  INR 1.0   Cardiac Enzymes: No results for input(s): CKTOTAL, CKMB, CKMBINDEX,  TROPONINI in the last 168 hours. BNP (last 3 results) No results for input(s): PROBNP in the last 8760 hours. HbA1C: No results for input(s): HGBA1C in the last 72 hours. CBG: Recent Labs  Lab 07/01/19 0721 07/02/19 0810 07/03/19 0722 07/04/19 0743 07/05/19 0750  GLUCAP 123* 122* 106* 117* 86   Lipid Profile: No results for input(s): CHOL, HDL, LDLCALC, TRIG, CHOLHDL, LDLDIRECT in the last 72 hours. Thyroid Function Tests: No results for input(s): TSH, T4TOTAL, FREET4, T3FREE, THYROIDAB in the last 72 hours. Anemia Panel: No results for input(s): VITAMINB12, FOLATE, FERRITIN, TIBC, IRON, RETICCTPCT  in the last 72 hours. Sepsis Labs: No results for input(s): PROCALCITON, LATICACIDVEN in the last 168 hours.  Recent Results (from the past 240 hour(s))  SARS CORONAVIRUS 2 (TAT 6-24 HRS) Nasopharyngeal Nasopharyngeal Swab     Status: None   Collection Time: 06/29/19 11:38 AM   Specimen: Nasopharyngeal Swab  Result Value Ref Range Status   SARS Coronavirus 2 NEGATIVE NEGATIVE Final    Comment: (NOTE) SARS-CoV-2 target nucleic acids are NOT DETECTED. The SARS-CoV-2 RNA is generally detectable in upper and lower respiratory specimens during the acute phase of infection. Negative results do not preclude SARS-CoV-2 infection, do not rule out co-infections with other pathogens, and should not be used as the sole basis for treatment or other patient management decisions. Negative results must be combined with clinical observations, patient history, and epidemiological information. The expected result is Negative. Fact Sheet for Patients: HairSlick.nohttps://www.fda.gov/media/138098/download Fact Sheet for Healthcare Providers: quierodirigir.comhttps://www.fda.gov/media/138095/download This test is not yet approved or cleared by the [place] FDA and  has been authorized for detection and/or diagnosis of SARS-CoV-2 by FDA under an Emergency Use Authorization (EUA). This EUA will remain  in effect (meaning this test can be used) for the duration of the COVID-19 declaration under Section 56 4(b)(1) of the Act, 21 U.S.C. section 360bbb-3(b)(1), unless the authorization is terminated or revoked sooner. Performed at [facility], [address] [place], [place] [address]          Radiology Studies: No results found.      Scheduled Meds: . donepezil  10 mg Oral QHS  . enoxaparin (LOVENOX) injection  40 mg Subcutaneous QHS  . furosemide  40 mg Oral BID  . predniSONE  40 mg Oral BID WC  . sodium chloride flush  3 mL Intravenous Q12H   Continuous Infusions: . sodium chloride       LOS: 5  days    Time spent: 32 minutes spent on chart review, discussion with nursing staff, consultants, updating family and interview/physical exam; more than 50% of that time was spent in counseling and/or coordination of care.    Alvira PhilipsEric J [place], DO [facility] Pager [phone]  If 7PM-7AM, please contact night-coverage [email or web address] Password [email or web address] 07/05/2019, 5:06 PM

## 2019-07-06 DIAGNOSIS — J9621 Acute and chronic respiratory failure with hypoxia: Secondary | ICD-10-CM

## 2019-07-06 DIAGNOSIS — J441 Chronic obstructive pulmonary disease with (acute) exacerbation: Secondary | ICD-10-CM

## 2019-07-06 LAB — CREATININE, SERUM
Creatinine, Ser: 0.67 mg/dL (ref 0.44–1.00)
GFR calc Af Amer: >60 mL/min (ref >60)
GFR calc non Af Amer: >60 mL/min (ref >60)

## 2019-07-06 LAB — GLUCOSE, CAPILLARY: Glucose-Capillary: 128 mg/dL — ABNORMAL HIGH (ref 70–99)

## 2019-07-06 MED ORDER — PREDNISONE 5 MG PO TABS: ORAL_TABLET | ORAL | 0 refills | Status: AC

## 2019-07-06 MED ORDER — FUROSEMIDE 40 MG PO TABS: 40.0000 mg | ORAL_TABLET | Freq: Two times a day (BID) | ORAL | 0 refills | Status: AC

## 2019-07-06 MED ORDER — SPIRIVA HANDIHALER 18 MCG IN CAPS: 18.0000 ug | ORAL_CAPSULE | Freq: Every day | RESPIRATORY_TRACT | 0 refills | Status: AC

## 2019-07-06 MED ORDER — ALBUTEROL SULFATE HFA 108 (90 BASE) MCG/ACT IN AERS: 2.0000 | INHALATION_SPRAY | Freq: Four times a day (QID) | RESPIRATORY_TRACT | 0 refills | Status: AC | PRN

## 2019-07-06 MED ORDER — POTASSIUM CHLORIDE CRYS ER 10 MEQ PO TBCR: 10.0000 meq | EXTENDED_RELEASE_TABLET | Freq: Every day | ORAL | 0 refills | Status: AC

## 2019-07-06 MED ORDER — BREO ELLIPTA 100-25 MCG/INH IN AEPB: 1.0000 | INHALATION_SPRAY | Freq: Every day | RESPIRATORY_TRACT | 0 refills | Status: AC

## 2019-07-06 MED ORDER — DONEPEZIL HCL 10 MG PO TABS: 10.0000 mg | ORAL_TABLET | Freq: Every day | ORAL | 0 refills | Status: AC

## 2019-07-06 NOTE — Discharge Summary (Signed)
Physician Discharge Summary  [name]:[id number] DOB: 1927-11-10 DOA: 06/29/2019  PCP: Merlene Laughter, MD  Admit date: 06/29/2019 Discharge date: 07/06/2019  Admitted From: Home Disposition:  [facility]  Recommendations for Outpatient Follow-up:  1. Follow-up with PCP/physician at facility within 1 week following hospital discharge 2. Please obtain BMP in one week  Home Health: No Equipment/Devices: Oxygen 3 L per nasal cannula  Discharge Condition: Stable CODE STATUS: DNR Diet recommendation: Heart Healthy   History of present illness:  Susan Landry a 85 y.o.femalewith medical history significant forchronic leg swelling, mild cognitive impairment, presenting to the emergency department for evaluation of worsening dyspnea and low oxygen saturations. Patient reports a long history of exertional dyspnea but this has slowly worsened. She has a mildchroniccough but denies any fevers or chills. She reports chronic bilateral leg swelling that is unchanged. She denies orthopnea. She denies fevers or chills. She denies chest pain. She denies any smoking history but notes that she lived with heavy smokers, including her husband of 65 years. She hasbeen evaluated for this in the outpatient settings withoxygen saturations in the low 80s.She does not have home oxygen. She had a similar presentation in 2015, was suspected to have chronic obstructive lung disease, and was referred for PFTs, but the patient does not believe that this was ever performed.  ED Course:Upon arrival to the ED, patient is found to be afebrile, saturating mid to upper 80s on room air, tachypneic, and with stable blood pressure. EKG features a sinus rhythm. CTA chest is negative for PE, notable for large sliding-type hiatal hernia and minimal bibasilar atelectasis, but no edema, pneumonia, mass, or pleural effusions. Chemistry panel is notable for a bicarbonate of 36 and elevated BUN to  creatinine ratio. CBC features a mild polycythemia. BNP and high-sensitivity troponin are normal. COVID-19 is negative. Patient was given 125 mg of IV Solu-Medrol, continuous albuterol neb, and placed on supplemental oxygen in the ED.  Hospital course:  Acute hypoxic respiratory failure Suspected underlying COPD with acute exacerbation Patient presenting with progressive dyspnea and chronic nonproductive cough. In the ED, patient was found to have an oxygen saturation of 80% on room air. Chest x-ray negative for focal consolidation/effusion or vascular congestion. CT angiogram chest negative for pulmonary embolism. high-sensitivity troponin 3, unrevealing. Afebrile without leukocytosis.  Echocardiogram with preserved EF.  Patient was initially started on duo nebs, IV steroids.  Likely undiagnosed COPD.  Will require continued supplemental oxygen outpatient, currently at 3 L/min via nasal cannula.  We will continue prednisone taper following discharge.  Started on Brio Ellipta and Spiriva.  Albuterol MDI as needed.  Chronic lower extremity edema No significant change in the amount of lower extremity edema, no orthopnea, BNP 74 within normal limits.  Echocardiogram with preserved EF, indeterminate pattern of LV diastolic filling. Continue home furosemide 40 mg p.o. twice daily  Dementia: Continue Aricept  Discharge Diagnoses:  Principal Problem:   Respiratory failure with hypoxia (HCC) Active Problems:   MILD COGNITIVE IMPAIRMENT SO STATED    Discharge Instructions  Discharge Instructions    Call MD for:  difficulty breathing, headache or visual disturbances   Complete by: As directed    Call MD for:  extreme fatigue   Complete by: As directed    Call MD for:  persistant dizziness or light-headedness   Complete by: As directed    Call MD for:  persistant nausea and vomiting   Complete by: As directed    Call MD for:  severe  uncontrolled pain   Complete by: As directed     Call MD for:  temperature >100.4   Complete by: As directed    Diet - low sodium heart healthy   Complete by: As directed    Increase activity slowly   Complete by: As directed      Allergies as of 07/06/2019   No Known Allergies     Medication List    STOP taking these medications   potassium chloride 8 MEQ tablet Commonly known as: KLOR-CON     TAKE these medications   albuterol 108 (90 Base) MCG/ACT inhaler Commonly known as: VENTOLIN HFA Inhale 2 puffs into the lungs every 6 (six) hours as needed for wheezing or shortness of breath.   Breo Ellipta 100-25 MCG/INH Aepb Generic drug: fluticasone furoate-vilanterol Inhale 1 puff into the lungs daily.   Calcium 600-200 MG-UNIT tablet Take 1 tablet by mouth daily.   donepezil 10 MG tablet Commonly known as: ARICEPT Take 1 tablet (10 mg total) by mouth at bedtime.   furosemide 40 MG tablet Commonly known as: LASIX Take 1 tablet (40 mg total) by mouth 2 (two) times daily.   multivitamin tablet Take 1 tablet by mouth at bedtime.   potassium chloride 10 MEQ tablet Commonly known as: KLOR-CON Take 1 tablet (10 mEq total) by mouth daily.   predniSONE 5 MG tablet Commonly known as: DELTASONE Take 6 tablets (30 mg total) by mouth daily with breakfast for 2 days, THEN 4 tablets (20 mg total) daily with breakfast for 2 days, THEN 2 tablets (10 mg total) daily with breakfast for 2 days, THEN 1 tablet (5 mg total) daily with breakfast for 2 days. Start taking on: July 06, 2019   PRESERVISION AREDS 2 PO Take 1 tablet by mouth 2 (two) times daily.   Spiriva HandiHaler 18 MCG inhalation capsule Generic drug: tiotropium Place 1 capsule (18 mcg total) into inhaler and inhale daily.       Contact information for follow-up providers    Merlene Laughter, MD.   Specialty: Internal Medicine Contact information: [address] [place] [address] [phone]            Contact information for  after-discharge care    Destination    [facility] .   Service: Skilled Nursing Contact information: [address] [place] [address] [phone]                 No Known Allergies  Consultations:  none   Procedures/Studies: Dg Chest 2 View  Result Date: 06/29/2019 CLINICAL DATA:  Persistent elevation of the right hemidiaphragm. Stable hiatal hernia. No acute abnormality noted. EXAM: CHEST - 2 VIEW COMPARISON:  09/27/2014 FINDINGS: Cardiac shadow is at the upper limits of normal but stable. Aortic calcifications are seen. Elevation of the right hemidiaphragm is noted. Hiatal hernia is better visualized on today's exam. No focal infiltrate or sizable effusion is seen. Shunt catheter is noted along the right anterior chest wall. IMPRESSION: Chronic changes as described.  No acute abnormality noted. Electronically Signed   By: Alcide Clever M.D.   On: 06/29/2019 11:05   Ct Angio Chest Pe W/cm &/or Wo Cm  Result Date: 06/29/2019 CLINICAL DATA:  Chest pain. EXAM: CT ANGIOGRAPHY CHEST WITH CONTRAST TECHNIQUE: Multidetector CT imaging of the chest was performed using the standard protocol during bolus administration of intravenous contrast. Multiplanar CT image reconstructions and MIPs were obtained to evaluate the vascular anatomy. CONTRAST:  113mL OMNIPAQUE IOHEXOL 350 MG/ML SOLN COMPARISON:  None. FINDINGS: Cardiovascular: Satisfactory opacification of the pulmonary arteries to the segmental level. No evidence of pulmonary embolism. Normal heart size. No pericardial effusion. Coronary artery calcifications are noted. Atherosclerosis of thoracic aorta is noted. Mediastinum/Nodes: Large sliding-type hiatal hernia is noted. No adenopathy is noted. Thyroid gland is unremarkable. Lungs/Pleura: No pneumothorax or pleural effusion is noted. Elevated right hemidiaphragm is noted. Minimal bibasilar subsegmental atelectasis is noted. Upper Abdomen: No acute  abnormality. Musculoskeletal: No chest wall abnormality. No acute or significant osseous findings. Review of the MIP images confirms the above findings. IMPRESSION: No definite evidence of pulmonary embolus. Large sliding-type hiatal hernia. Coronary artery calcifications are noted. Minimal bibasilar subsegmental atelectasis. Aortic Atherosclerosis (ICD10-I70.0). Electronically Signed   By: Marijo Conception M.D.   On: 06/29/2019 16:50     Echocardiogram:  IMPRESSIONS   1. Left ventricular ejection fraction, by visual estimation, is 60 to 65%. The left ventricle has normal function. Normal left ventricular size. There is no left ventricular hypertrophy. 2. Left ventricular diastolic Doppler parameters are indeterminate pattern of LV diastolic filling. 3. Global right ventricle has normal systolic function.The right ventricular size is normal. No increase in right ventricular wall thickness. 4. Left atrial size was normal. 5. Right atrial size was mildly dilated. 6. Trivial pericardial effusion is present. 7. The mitral valve is normal in structure. No evidence of mitral valve regurgitation. No evidence of mitral stenosis. 8. The tricuspid valve is normal in structure. Tricuspid valve regurgitation is trivial. 9. The aortic valve is normal in structure. Aortic valve regurgitation was not visualized by color flow Doppler. Structurally normal aortic valve, with no evidence of sclerosis or stenosis. 10. The pulmonic valve was normal in structure. Pulmonic valve regurgitation is not visualized by color flow Doppler. 11. Normal pulmonary artery systolic pressure. 12. The inferior vena cava is normal in size with greater than 50% respiratory variability, suggesting right atrial pressure of 3 mmHg.   Subjective: Patient seen and examined at bedside, resting comfortably.  No complaints this morning.  Ready to discharge to SNF.  Repeat Covid-19/SARS-CoV-2 negative.  Denies headache, no  fever/chills/night sweats, no nausea cefonicid diarrhea, no chest pain, palpitations, no shortness of breath, no abdominal pain.  No acute events overnight per nursing staff.   Discharge Exam: Vitals:   07/05/19 2148 07/06/19 0613  BP: (!) 143/75 (!) 149/82  Pulse: 64 (!) 58  Resp: 17 16  Temp: (!) 97.5 F (36.4 C) (!) 97.5 F (36.4 C)  SpO2: 94% 97%   Vitals:   07/05/19 1334 07/05/19 2148 07/06/19 0500 07/06/19 0613  BP: 134/78 (!) 143/75  (!) 149/82  Pulse: 89 64  (!) 58  Resp:  17  16  Temp: 97.9 F (36.6 C) (!) 97.5 F (36.4 C)  (!) 97.5 F (36.4 C)  TempSrc: Oral Oral  Oral  SpO2: 100% 94%  97%  Weight:   80.3 kg   Height:        General: Pt is alert, awake, not in acute distress Cardiovascular: RRR, S1/S2 +, no rubs, no gallops Respiratory: CTA bilaterally, no wheezing, no rhonchi, oxygenating 97% on 3 L nasal cannula. Abdominal: Soft, NT, ND, bowel sounds + Extremities: no edema, no cyanosis    The results of significant diagnostics from this hospitalization (including imaging, microbiology, ancillary and laboratory) are listed below for reference.     Microbiology: Recent Results (from the past 240 hour(s))  SARS CORONAVIRUS 2 (TAT 6-24 HRS) Nasopharyngeal Nasopharyngeal  Swab     Status: None   Collection Time: 06/29/19 11:38 AM   Specimen: Nasopharyngeal Swab  Result Value Ref Range Status   SARS Coronavirus 2 NEGATIVE NEGATIVE Final    Comment: (NOTE) SARS-CoV-2 target nucleic acids are NOT DETECTED. The SARS-CoV-2 RNA is generally detectable in upper and lower respiratory specimens during the acute phase of infection. Negative results do not preclude SARS-CoV-2 infection, do not rule out co-infections with other pathogens, and should not be used as the sole basis for treatment or other patient management decisions. Negative results must be combined with clinical observations, patient history, and epidemiological information. The expected result is  Negative. Fact Sheet for Patients: HairSlick.nohttps://www.fda.gov/media/138098/download Fact Sheet for Healthcare Providers: quierodirigir.comhttps://www.fda.gov/media/138095/download This test is not yet approved or cleared by the [place] FDA and  has been authorized for detection and/or diagnosis of SARS-CoV-2 by FDA under an Emergency Use Authorization (EUA). This EUA will remain  in effect (meaning this test can be used) for the duration of the COVID-19 declaration under Section 56 4(b)(1) of the Act, 21 U.S.C. section 360bbb-3(b)(1), unless the authorization is terminated or revoked sooner. Performed at [facility], [address] [place], [place] [address]   SARS CORONAVIRUS 2 (TAT 6-24 HRS) Nasopharyngeal Nasopharyngeal Swab     Status: None   Collection Time: 07/05/19  2:08 PM   Specimen: Nasopharyngeal Swab  Result Value Ref Range Status   SARS Coronavirus 2 NEGATIVE NEGATIVE Final    Comment: (NOTE) SARS-CoV-2 target nucleic acids are NOT DETECTED. The SARS-CoV-2 RNA is generally detectable in upper and lower respiratory specimens during the acute phase of infection. Negative results do not preclude SARS-CoV-2 infection, do not rule out co-infections with other pathogens, and should not be used as the sole basis for treatment or other patient management decisions. Negative results must be combined with clinical observations, patient history, and epidemiological information. The expected result is Negative. Fact Sheet for Patients: HairSlick.nohttps://www.fda.gov/media/138098/download Fact Sheet for Healthcare Providers: quierodirigir.comhttps://www.fda.gov/media/138095/download This test is not yet approved or cleared by the [place] FDA and  has been authorized for detection and/or diagnosis of SARS-CoV-2 by FDA under an Emergency Use Authorization (EUA). This EUA will remain  in effect (meaning this test can be used) for the duration of the COVID-19 declaration under Section 56 4(b)(1) of the Act, 21  U.S.C. section 360bbb-3(b)(1), unless the authorization is terminated or revoked sooner. Performed at [facility], [address] [place], [place] [address]      Labs: BNP (last 3 results) Recent Labs    06/29/19 1138  BNP 74.0   Basic Metabolic Panel: Recent Labs  Lab 06/29/19 1138 06/30/19 0530 07/01/19 0538 07/04/19 0536 07/05/19 0531 07/06/19 0615  NA 142 138 139 143 142  --   K 3.9 3.8 4.0 5.5* 3.9  --   CL 97* 96* 97* 96* 95*  --   CO2 36* 30 32 37* 36*  --   GLUCOSE 91 163* 138* 131* 120*  --   BUN 25* 23 33* 41* 36*  --   CREATININE 0.87 0.77 0.80 0.84 0.77 0.67  CALCIUM 8.9 8.8* 9.2 8.8* 8.8*  --   MG  --   --  2.5*  --   --   --    Liver Function Tests: Recent Labs  Lab 06/29/19 1138  AST 18  ALT 20  ALKPHOS 106  BILITOT 0.8  PROT 7.0  ALBUMIN 3.8   No results for input(s): LIPASE,  AMYLASE in the last 168 hours. No results for input(s): AMMONIA in the last 168 hours. CBC: Recent Labs  Lab 06/29/19 1138 06/30/19 0530 07/01/19 0538 07/04/19 0536 07/05/19 0531  WBC 7.1 8.3 10.5 8.2 9.5  NEUTROABS 5.1  --   --  7.4 8.2*  HGB 15.3* 14.3 15.0 16.5* 16.2*  HCT 48.4* 45.8 46.7* 53.3* 51.9*  MCV 94.7 96.0 94.5 95.7 95.6  PLT 226 211 213 232 198   Cardiac Enzymes: No results for input(s): CKTOTAL, CKMB, CKMBINDEX, TROPONINI in the last 168 hours. BNP: Invalid input(s): POCBNP CBG: Recent Labs  Lab 07/02/19 0810 07/03/19 0722 07/04/19 0743 07/05/19 0750 07/06/19 0801  GLUCAP 122* 106* 117* 86 128*   D-Dimer No results for input(s): DDIMER in the last 72 hours. Hgb A1c No results for input(s): HGBA1C in the last 72 hours. Lipid Profile No results for input(s): CHOL, HDL, LDLCALC, TRIG, CHOLHDL, LDLDIRECT in the last 72 hours. Thyroid function studies No results for input(s): TSH, T4TOTAL, T3FREE, THYROIDAB in the last 72 hours.  Invalid input(s): FREET3 Anemia work up No results for input(s): VITAMINB12, FOLATE, FERRITIN,  TIBC, IRON, RETICCTPCT in the last 72 hours. Urinalysis    Component Value Date/Time   COLORURINE YELLOW 05/04/2014 1055   APPEARANCEUR CLOUDY (A) 05/04/2014 1055   LABSPEC 1.009 05/04/2014 1055   PHURINE 8.0 05/04/2014 1055   GLUCOSEU NEGATIVE 05/04/2014 1055   HGBUR NEGATIVE 05/04/2014 1055   HGBUR negative 01/04/2010 0808   BILIRUBINUR NEGATIVE 05/04/2014 1055   BILIRUBINUR n 07/16/2011 1320   KETONESUR NEGATIVE 05/04/2014 1055   PROTEINUR NEGATIVE 05/04/2014 1055   UROBILINOGEN 0.2 05/04/2014 1055   NITRITE NEGATIVE 05/04/2014 1055   LEUKOCYTESUR SMALL (A) 05/04/2014 1055   Sepsis Labs Invalid input(s): PROCALCITONIN,  WBC,  LACTICIDVEN Microbiology Recent Results (from the past 240 hour(s))  SARS CORONAVIRUS 2 (TAT 6-24 HRS) Nasopharyngeal Nasopharyngeal Swab     Status: None   Collection Time: 06/29/19 11:38 AM   Specimen: Nasopharyngeal Swab  Result Value Ref Range Status   SARS Coronavirus 2 NEGATIVE NEGATIVE Final    Comment: (NOTE) SARS-CoV-2 target nucleic acids are NOT DETECTED. The SARS-CoV-2 RNA is generally detectable in upper and lower respiratory specimens during the acute phase of infection. Negative results do not preclude SARS-CoV-2 infection, do not rule out co-infections with other pathogens, and should not be used as the sole basis for treatment or other patient management decisions. Negative results must be combined with clinical observations, patient history, and epidemiological information. The expected result is Negative. Fact Sheet for Patients: HairSlick.no Fact Sheet for Healthcare Providers: quierodirigir.com This test is not yet approved or cleared by the [place] FDA and  has been authorized for detection and/or diagnosis of SARS-CoV-2 by FDA under an Emergency Use Authorization (EUA). This EUA will remain  in effect (meaning this test can be used) for the duration of the COVID-19  declaration under Section 56 4(b)(1) of the Act, 21 U.S.C. section 360bbb-3(b)(1), unless the authorization is terminated or revoked sooner. Performed at [facility], [address] [place], [place] [address]   SARS CORONAVIRUS 2 (TAT 6-24 HRS) Nasopharyngeal Nasopharyngeal Swab     Status: None   Collection Time: 07/05/19  2:08 PM   Specimen: Nasopharyngeal Swab  Result Value Ref Range Status   SARS Coronavirus 2 NEGATIVE NEGATIVE Final    Comment: (NOTE) SARS-CoV-2 target nucleic acids are NOT DETECTED. The SARS-CoV-2 RNA is generally detectable in upper and lower respiratory specimens during the  acute phase of infection. Negative results do not preclude SARS-CoV-2 infection, do not rule out co-infections with other pathogens, and should not be used as the sole basis for treatment or other patient management decisions. Negative results must be combined with clinical observations, patient history, and epidemiological information. The expected result is Negative. Fact Sheet for Patients: HairSlick.no Fact Sheet for Healthcare Providers: quierodirigir.com This test is not yet approved or cleared by the [place] FDA and  has been authorized for detection and/or diagnosis of SARS-CoV-2 by FDA under an Emergency Use Authorization (EUA). This EUA will remain  in effect (meaning this test can be used) for the duration of the COVID-19 declaration under Section 56 4(b)(1) of the Act, 21 U.S.C. section 360bbb-3(b)(1), unless the authorization is terminated or revoked sooner. Performed at [facility], [address], [place], [place] [address]      Time coordinating discharge: Over 30 minutes  SIGNED:   Alvira Philips [place], DO  [facility] 07/06/2019, 10:45 AM

## 2019-07-06 NOTE — TOC Transition Note (Signed)
Transition of Care [facility]) - CM/SW Discharge Note   Patient Details  Name: Susan Landry MRN: [id number] Date of Birth: 01-Oct-1927  [facility]) CM/SW Contact:  Lynnell Catalan, RN Phone Number: 07/06/2019, 9:50 AM   Clinical Narrative:    Pt to dc to [facility] today. She will be going to room 607. Phone number for report is   [phone]. Daughter called to inform of PTAR pick up at noon.     Barriers to Discharge: Barriers Resolved   Patient Goals and CMS Choice Patient states their goals for this hospitalization and ongoing recovery are:: I am happy to be going back to [employer].gov Compare Post Acute Care list provided to:: Other (Comment Required)(daughter) Choice offered to / list presented to : Patient, Adult Children  Discharge Placement              Patient chooses bed at: [facility] Patient to be transferred to facility by: [place] Name of family member notified: daughter Lattie Haw    Discharge Plan and Services In-house Referral: Clinical Social Work   Post Acute Care Choice: [facility]                               Social Determinants of Health (SDOH) Interventions     Readmission Risk Interventions No flowsheet data found.

## 2019-07-06 NOTE — Progress Notes (Signed)
Called report to [place], RN at 12:20.

## 2019-07-27 ENCOUNTER — Ambulatory Visit: Payer: Medicare Other | Admitting: Podiatry

## 2019-08-10 ENCOUNTER — Telehealth: Payer: Self-pay

## 2019-08-10 NOTE — Telephone Encounter (Signed)
Spoke with patient's daughter to introduce Palliative Care and to offer to schedule visit with NP. Verbal consent obtained. Visit scheduled with Enid Derry NP on 08/12/2019 @ 11 am. Patient is at [place] [address].

## 2019-08-12 ENCOUNTER — Other Ambulatory Visit: Payer: Medicare Other | Admitting: Internal Medicine

## 2019-08-12 ENCOUNTER — Other Ambulatory Visit: Payer: Self-pay

## 2019-08-12 NOTE — Progress Notes (Unsigned)
    [facility] Consult Note Telephone: [phone]  Fax: [phone]  PATIENT NAME: Susan Landry DOB: 14-Dec-1927 MRN: [id number]  PRIMARY CARE PROVIDER:   Lajean Manes, MD  REFERRING PROVIDER:  Lajean Manes, MD [address] [place],  [place] [address]  RESPONSIBLE PARTY:     ASSESSMENT:        RECOMMENDATIONS and PLAN:  1.  I spent *** minutes providing this consultation,  from *** to ***. More than 50% of the time in this consultation was spent coordinating communication.   HISTORY OF PRESENT ILLNESS:  Susan Landry is a 85 y.o. year old female with multiple medical problems including ***. Palliative Care was asked to help address goals of care.   CODE STATUS:   PPS: 0% HOSPICE ELIGIBILITY/DIAGNOSIS: TBD  PAST MEDICAL HISTORY:  Past Medical History:  Diagnosis Date  . Cerebral aneurysm   . HYPERTENSION 07/14/2007  . MILD COGNITIVE IMPAIRMENT SO STATED 07/13/2009  . OSTEOARTHRITIS 01/12/2008  . OVERACTIVE BLADDER 07/14/2008    SOCIAL HX:  Social History   Tobacco Use  . Smoking status: Never Smoker  . Smokeless tobacco: Never Used  Substance Use Topics  . Alcohol use: Yes    ALLERGIES: No Known Allergies   PERTINENT MEDICATIONS:  Outpatient Encounter Medications as of 08/12/2019  Medication Sig  . albuterol (VENTOLIN HFA) 108 (90 Base) MCG/ACT inhaler Inhale 2 puffs into the lungs every 6 (six) hours as needed for wheezing or shortness of breath.  . Calcium 600-200 MG-UNIT tablet Take 1 tablet by mouth daily.  Marland Kitchen donepezil (ARICEPT) 10 MG tablet Take 1 tablet (10 mg total) by mouth at bedtime.  . fluticasone furoate-vilanterol (BREO ELLIPTA) 100-25 MCG/INH AEPB Inhale 1 puff into the lungs daily.  . furosemide (LASIX) 40 MG tablet Take 1 tablet (40 mg total) by mouth 2 (two) times daily.  . Multiple Vitamin (MULTIVITAMIN) tablet Take 1 tablet by mouth at bedtime.   . Multiple Vitamins-Minerals  (PRESERVISION AREDS 2 PO) Take 1 tablet by mouth 2 (two) times daily.  . potassium chloride SA (KLOR-CON) 10 MEQ tablet Take 1 tablet (10 mEq total) by mouth daily.  Marland Kitchen tiotropium (SPIRIVA HANDIHALER) 18 MCG inhalation capsule Place 1 capsule (18 mcg total) into inhaler and inhale daily.   No facility-administered encounter medications on file as of 08/12/2019.     PHYSICAL EXAM:   General: NAD, frail appearing, thin Cardiovascular: regular rate and rhythm Pulmonary: clear ant fields Abdomen: soft, nontender, + bowel sounds GU: no suprapubic tenderness Extremities: no edema, no joint deformities Skin: no rashes Neurological: Weakness but otherwise nonfocal  Gonzella Lex, NP

## 2019-08-23 ENCOUNTER — Other Ambulatory Visit: Payer: Self-pay

## 2019-08-23 ENCOUNTER — Other Ambulatory Visit: Payer: Medicare Other | Admitting: Internal Medicine

## 2019-08-23 NOTE — Progress Notes (Unsigned)
    [place] [facility] Consult Note Telephone: [phone]  Fax: [phone]  PATIENT NAME: Susan Landry DOB: Sep 12, 1927 MRN: [id number]  PRIMARY CARE PROVIDER:   Lajean Manes, MD  REFERRING PROVIDER:  Lajean Manes, MD [address] [place],  [place] [address]  RESPONSIBLE PARTY:   daughter     RECOMMENDATIONS and PLAN:  Palliative Care Encounter Z51.5  1.  Advance Care Planning  2.  Edema:  Improved.  Continue use of diuretics, leg compression and elevation.  Weight weekly.  Stable at 134-135#  3.  Insomnia:  Improved with moving second dose of diuretic to mid day rather than at bedtime.    4.  Gait instability:  Improved.  Continue physical therapy.  Bedside commode use during the night to minimize risk of falls.   I spent 35 minutes providing this consultation,  from 1215 to 1250. More than 50% of the time in this consultation was spent coordinating communication with patient and daughter.   HISTORY OF PRESENT ILLNESS: Followup with [name] .  No reports of illnesses or injuries since last visit. Sleeping better since swithching 2nd Furosemide dose to lunch time.  Palliative Care was asked to help address goals of care.   CODE STATUS: DNAR/DNI  PPS: 50% HOSPICE ELIGIBILITY/DIAGNOSIS: TBD  PAST MEDICAL HISTORY:  Past Medical History:  Diagnosis Date  . Cerebral aneurysm   . HYPERTENSION 07/14/2007  . MILD COGNITIVE IMPAIRMENT SO STATED 07/13/2009  . OSTEOARTHRITIS 01/12/2008  . OVERACTIVE BLADDER 07/14/2008     ALLERGIES: No Known Allergies   PERTINENT MEDICATIONS:  Outpatient Encounter Medications as of 08/23/2019  Medication Sig  . albuterol (VENTOLIN HFA) 108 (90 Base) MCG/ACT inhaler Inhale 2 puffs into the lungs every 6 (six) hours as needed for wheezing or shortness of breath.  . Calcium 600-200 MG-UNIT tablet Take 1 tablet by mouth daily.  Marland Kitchen donepezil (ARICEPT) 10 MG tablet Take 1 tablet (10 mg  total) by mouth at bedtime.  . fluticasone furoate-vilanterol (BREO ELLIPTA) 100-25 MCG/INH AEPB Inhale 1 puff into the lungs daily.  . furosemide (LASIX) 40 MG tablet Take 1 tablet (40 mg total) by mouth 2 (two) times daily.  . Multiple Vitamin (MULTIVITAMIN) tablet Take 1 tablet by mouth at bedtime.   . Multiple Vitamins-Minerals (PRESERVISION AREDS 2 PO) Take 1 tablet by mouth 2 (two) times daily.  . potassium chloride SA (KLOR-CON) 10 MEQ tablet Take 1 tablet (10 mEq total) by mouth daily.  Marland Kitchen tiotropium (SPIRIVA HANDIHALER) 18 MCG inhalation capsule Place 1 capsule (18 mcg total) into inhaler and inhale daily.   No facility-administered encounter medications on file as of 08/23/2019.    PHYSICAL EXAM:   General: NAD, frail appearing, thin Cardiovascular: regular rate and rhythm Pulmonary: Diminished but clear breath sounds LUL and LLL  Sats 94% with O2 via [place] Abdomen: soft, nontender, + bowel sounds Extremities: 1+edema Skin: exposed skin is intact Neurological: Weakness but otherwise nonfocal  Gonzella Lex, NP

## 2019-09-20 ENCOUNTER — Other Ambulatory Visit: Payer: Medicare Other | Admitting: Internal Medicine

## 2019-09-20 ENCOUNTER — Other Ambulatory Visit: Payer: Self-pay

## 2019-11-09 ENCOUNTER — Other Ambulatory Visit: Payer: Self-pay

## 2019-11-09 ENCOUNTER — Other Ambulatory Visit: Payer: Medicare Other | Admitting: Internal Medicine

## 2019-11-10 ENCOUNTER — Other Ambulatory Visit: Payer: Self-pay

## 2020-01-27 ENCOUNTER — Emergency Department (HOSPITAL_COMMUNITY): Payer: Medicare Other

## 2020-01-27 ENCOUNTER — Inpatient Hospital Stay (HOSPITAL_COMMUNITY)
Admission: EM | Admit: 2020-01-27 | Discharge: 2020-01-31 | DRG: 871 | Disposition: A | Discharge status: Hospice | Payer: Medicare Other | Source: Skilled Nursing Facility | Attending: Internal Medicine | Admitting: Internal Medicine

## 2020-01-27 DIAGNOSIS — Z7951 Long term (current) use of inhaled steroids: Secondary | ICD-10-CM

## 2020-01-27 DIAGNOSIS — R739 Hyperglycemia, unspecified: Secondary | ICD-10-CM | POA: Diagnosis present

## 2020-01-27 DIAGNOSIS — F0390 Unspecified dementia without behavioral disturbance: Secondary | ICD-10-CM | POA: Diagnosis present

## 2020-01-27 DIAGNOSIS — E1140 Type 2 diabetes mellitus with diabetic neuropathy, unspecified: Secondary | ICD-10-CM | POA: Diagnosis present

## 2020-01-27 DIAGNOSIS — J9601 Acute respiratory failure with hypoxia: Secondary | ICD-10-CM | POA: Diagnosis present

## 2020-01-27 DIAGNOSIS — Z6837 Body mass index (BMI) 37.0-37.9, adult: Secondary | ICD-10-CM

## 2020-01-27 DIAGNOSIS — J189 Pneumonia, unspecified organism: Secondary | ICD-10-CM | POA: Diagnosis present

## 2020-01-27 DIAGNOSIS — A419 Sepsis, unspecified organism: Principal | ICD-10-CM | POA: Diagnosis present

## 2020-01-27 DIAGNOSIS — Z66 Do not resuscitate: Secondary | ICD-10-CM | POA: Diagnosis present

## 2020-01-27 DIAGNOSIS — Z79899 Other long term (current) drug therapy: Secondary | ICD-10-CM

## 2020-01-27 DIAGNOSIS — E1165 Type 2 diabetes mellitus with hyperglycemia: Secondary | ICD-10-CM | POA: Diagnosis present

## 2020-01-27 DIAGNOSIS — Z96659 Presence of unspecified artificial knee joint: Secondary | ICD-10-CM | POA: Diagnosis present

## 2020-01-27 DIAGNOSIS — Z20822 Contact with and (suspected) exposure to covid-19: Secondary | ICD-10-CM | POA: Diagnosis present

## 2020-01-27 DIAGNOSIS — I10 Essential (primary) hypertension: Secondary | ICD-10-CM | POA: Diagnosis present

## 2020-01-27 DIAGNOSIS — J440 Chronic obstructive pulmonary disease with acute lower respiratory infection: Secondary | ICD-10-CM | POA: Diagnosis present

## 2020-01-27 DIAGNOSIS — N179 Acute kidney failure, unspecified: Secondary | ICD-10-CM | POA: Diagnosis present

## 2020-01-27 LAB — BRAIN NATRIURETIC PEPTIDE: B Natriuretic Peptide: 356.6 pg/mL — ABNORMAL HIGH (ref 0.0–100.0)

## 2020-01-27 LAB — SARS CORONAVIRUS 2 BY RT PCR (HOSPITAL ORDER, PERFORMED IN ~~LOC~~ HOSPITAL LAB): SARS Coronavirus 2: NEGATIVE

## 2020-01-27 LAB — COMPREHENSIVE METABOLIC PANEL
ALT: 23 U/L (ref 0–44)
AST: 33 U/L (ref 15–41)
Albumin: 3.9 g/dL (ref 3.5–5.0)
Alkaline Phosphatase: 117 U/L (ref 38–126)
Anion gap: 18 — ABNORMAL HIGH (ref 5–15)
BUN: 22 mg/dL (ref 8–23)
CO2: 30 mmol/L (ref 22–32)
Calcium: 9.9 mg/dL (ref 8.9–10.3)
Chloride: 88 mmol/L — ABNORMAL LOW (ref 98–111)
Creatinine, Ser: 1.15 mg/dL — ABNORMAL HIGH (ref 0.44–1.00)
GFR calc Af Amer: 49 mL/min — ABNORMAL LOW (ref >60)
GFR calc non Af Amer: 42 mL/min — ABNORMAL LOW (ref >60)
Glucose, Bld: 310 mg/dL — ABNORMAL HIGH (ref 70–99)
Potassium: 4.6 mmol/L (ref 3.5–5.1)
Sodium: 136 mmol/L (ref 135–145)
Total Bilirubin: 0.9 mg/dL (ref 0.3–1.2)
Total Protein: 7.5 g/dL (ref 6.5–8.1)

## 2020-01-27 LAB — POCT I-STAT EG7
Acid-Base Excess: 7 mmol/L — ABNORMAL HIGH (ref 0.0–2.0)
Bicarbonate: 33.5 mmol/L — ABNORMAL HIGH (ref 20.0–28.0)
Calcium, Ion: 1.02 mmol/L — ABNORMAL LOW (ref 1.15–1.40)
HCT: 45 % (ref 36.0–46.0)
Hemoglobin: 15.3 g/dL — ABNORMAL HIGH (ref 12.0–15.0)
O2 Saturation: 76 %
Potassium: 4.2 mmol/L (ref 3.5–5.1)
Sodium: 131 mmol/L — ABNORMAL LOW (ref 135–145)
TCO2: 35 mmol/L — ABNORMAL HIGH (ref 22–32)
pCO2, Ven: 55.4 mmHg (ref 44.0–60.0)
pH, Ven: 7.389 (ref 7.250–7.430)
pO2, Ven: 42 mmHg (ref 32.0–45.0)

## 2020-01-27 LAB — CBC WITH DIFFERENTIAL/PLATELET
Abs Immature Granulocytes: 0.09 10*3/uL — ABNORMAL HIGH (ref 0.00–0.07)
Basophils Absolute: 0.1 10*3/uL (ref 0.0–0.1)
Basophils Relative: 1 %
Eosinophils Absolute: 0 10*3/uL (ref 0.0–0.5)
Eosinophils Relative: 0 %
HCT: 43.9 % (ref 36.0–46.0)
Hemoglobin: 13.6 g/dL (ref 12.0–15.0)
Immature Granulocytes: 1 %
Lymphocytes Relative: 18 %
Lymphs Abs: 1.9 10*3/uL (ref 0.7–4.0)
MCH: 29.1 pg (ref 26.0–34.0)
MCHC: 31 g/dL (ref 30.0–36.0)
MCV: 94 fL (ref 80.0–100.0)
Monocytes Absolute: 0.6 10*3/uL (ref 0.1–1.0)
Monocytes Relative: 6 %
Neutro Abs: 7.9 10*3/uL — ABNORMAL HIGH (ref 1.7–7.7)
Neutrophils Relative %: 74 %
Platelets: 271 10*3/uL (ref 150–400)
RBC: 4.67 MIL/uL (ref 3.87–5.11)
RDW: 15.7 % — ABNORMAL HIGH (ref 11.5–15.5)
WBC: 10.7 10*3/uL — ABNORMAL HIGH (ref 4.0–10.5)
nRBC: 0 % (ref 0.0–0.2)

## 2020-01-27 LAB — POC SARS CORONAVIRUS 2 AG -  ED: SARS Coronavirus 2 Ag: NEGATIVE

## 2020-01-27 LAB — LACTIC ACID, PLASMA
Lactic Acid, Venous: 4.4 mmol/L (ref 0.5–1.9)
Lactic Acid, Venous: 4.1 mmol/L (ref 0.5–1.9)

## 2020-01-27 LAB — TROPONIN I (HIGH SENSITIVITY)
Troponin I (High Sensitivity): 27 ng/L — ABNORMAL HIGH (ref <18)
Troponin I (High Sensitivity): 50 ng/L — ABNORMAL HIGH (ref <18)

## 2020-01-27 MED ORDER — IOHEXOL 300 MG/ML  SOLN
75.0000 mL | Freq: Once | INTRAMUSCULAR | Status: AC | PRN
  Administered 2020-01-27: 75 mL via INTRAVENOUS

## 2020-01-27 MED ORDER — SODIUM CHLORIDE 0.9 % IV SOLN
1.0000 g | Freq: Once | INTRAVENOUS | Status: AC
  Administered 2020-01-27: 1 g via INTRAVENOUS
  Filled 2020-01-27: qty 10

## 2020-01-27 MED ORDER — SODIUM CHLORIDE 0.9 % IV BOLUS
400.0000 mL | Freq: Once | INTRAVENOUS | Status: AC
  Administered 2020-01-27: 400 mL via INTRAVENOUS

## 2020-01-27 MED ORDER — SODIUM CHLORIDE 0.9 % IV SOLN
500.0000 mg | Freq: Once | INTRAVENOUS | Status: AC
  Administered 2020-01-27: 500 mg via INTRAVENOUS
  Filled 2020-01-27: qty 500

## 2020-01-27 MED ORDER — FUROSEMIDE 10 MG/ML IJ SOLN
40.0000 mg | INTRAMUSCULAR | Status: AC
  Administered 2020-01-27: 40 mg via INTRAVENOUS
  Filled 2020-01-27: qty 4

## 2020-01-27 MED ORDER — SODIUM CHLORIDE 0.9 % IV BOLUS
2000.0000 mL | Freq: Once | INTRAVENOUS | Status: AC
  Administered 2020-01-27: 2000 mL via INTRAVENOUS

## 2020-01-27 NOTE — ED Provider Notes (Signed)
[facility] EMERGENCY DEPARTMENT Provider Note   CSN: [id number] Arrival date & time: 01/27/20  8413     History Chief Complaint  Patient presents with  . Loss of Consciousness    Susan Landry is a 86 y.o. female.  86 year old female with past medical history below including dCHF, HTN who p/w respiratory distress. EMS picked the patient up from her independent living facility for respiratory distress noted today.  Patient reportedly has dementia but is usually alert and conversant. Staff found her in her room later today unresponsive w/ emesis. She received a few assisted ventilations followed by nonrebreather.  Blood glucose 277.  LEVEL 5 CAVEAT DUE TO RESPIRATORY DISTRESS  The history is provided by the EMS personnel.  Loss of Consciousness      Past Medical History:  Diagnosis Date  . Cerebral aneurysm   . HYPERTENSION 07/14/2007  . MILD COGNITIVE IMPAIRMENT SO STATED 07/13/2009  . OSTEOARTHRITIS 01/12/2008  . OVERACTIVE BLADDER 07/14/2008    Patient Active Problem List   Diagnosis Date Noted  . Respiratory failure with hypoxia (HCC) 06/29/2019  . Pain due to onychomycosis of toenails of both feet 04/21/2019  . Diabetic neuropathy (HCC) 04/21/2019  . Angioedema 05/04/2014  . Acute diastolic heart failure (HCC) 10/06/2013  . Influenza with respiratory manifestations 10/06/2013  . Fall 10/03/2013  . Ear abrasion 10/03/2013  . Acute respiratory failure with hypoxia (HCC) 10/03/2013  . DOE (dyspnea on exertion) 06/11/2011  . MILD COGNITIVE IMPAIRMENT SO STATED 07/13/2009  . OVERACTIVE BLADDER 07/14/2008  . OSTEOARTHRITIS 01/12/2008  . HYPERTENSION 07/14/2007    Past Surgical History:  Procedure Laterality Date  . BREAST SURGERY     abcess  . CSF SHUNT     cerebral aneurysm  . TONSILLECTOMY    . TOTAL KNEE ARTHROPLASTY       OB History   No obstetric history on file.     Family History  Problem Relation Age of Onset  . Cancer Father      Social History   Tobacco Use  . Smoking status: Never Smoker  . Smokeless tobacco: Never Used  Substance Use Topics  . Alcohol use: Yes  . Drug use: No    Home Medications Prior to Admission medications   Medication Sig Start Date End Date Taking? Authorizing Provider  albuterol (VENTOLIN HFA) 108 (90 Base) MCG/ACT inhaler Inhale 2 puffs into the lungs every 6 (six) hours as needed for wheezing or shortness of breath. 07/06/19  Yes [place], Eric J, DO  Calcium 600-200 MG-UNIT tablet Take 1 tablet by mouth daily.   Yes [provider]  donepezil (ARICEPT) 10 MG tablet Take 1 tablet (10 mg total) by mouth at bedtime. 07/06/19 01/27/20 Yes [place], Eric J, DO  fluticasone furoate-vilanterol (BREO ELLIPTA) 100-25 MCG/INH AEPB Inhale 1 puff into the lungs daily. 07/06/19  Yes [place], Eric J, DO  furosemide (LASIX) 40 MG tablet Take 1 tablet (40 mg total) by mouth 2 (two) times daily. 07/06/19 01/27/20 Yes [place], Alvira Philips, DO  Multiple Vitamin (MULTIVITAMIN) tablet Take 1 tablet by mouth at bedtime.    Yes [provider]  Multiple Vitamins-Minerals (PRESERVISION AREDS 2 PO) Take 1 tablet by mouth 2 (two) times daily.   Yes [provider]  potassium chloride SA (KLOR-CON) 10 MEQ tablet Take 1 tablet (10 mEq total) by mouth daily. 07/06/19 01/27/20 Yes [place], Eric J, DO  tiotropium (SPIRIVA HANDIHALER) 18 MCG inhalation capsule Place 1 capsule (18 mcg total) into  inhaler and inhale daily. 07/06/19 01/27/20 Yes [place] Donnamarie Poag, DO    Allergies    Patient has no known allergies.  Review of Systems   Review of Systems  Unable to perform ROS: Mental status change  Cardiovascular: Positive for syncope.    Physical Exam Updated Vital Signs BP 94/82   Pulse 89   Temp (!) 96.5 F (35.8 C) (Axillary)   Resp 19   SpO2 97%   Physical Exam Vitals and nursing note reviewed.  Constitutional:      General: She is in acute distress.     Appearance: She is  well-developed. She is toxic-appearing.     Comments: In respiratory distress  HENT:     Head: Normocephalic and atraumatic.  Eyes:     Conjunctiva/sclera: Conjunctivae normal.  Cardiovascular:     Rate and Rhythm: Regular rhythm. Tachycardia present.     Heart sounds: Normal heart sounds. No murmur.  Pulmonary:     Effort: Respiratory distress present.     Breath sounds: Rales present.  Abdominal:     General: Bowel sounds are normal. There is no distension.     Palpations: Abdomen is soft.     Tenderness: There is no abdominal tenderness.  Musculoskeletal:     Cervical back: Neck supple.     Right lower leg: Edema present.     Left lower leg: Edema present.  Skin:    General: Skin is warm and dry.  Neurological:     Comments: Disoriented, unable to speak 2/2 resp distress     ED Results / Procedures / Treatments   Labs (all labs ordered are listed, but only abnormal results are displayed) Labs Reviewed  COMPREHENSIVE METABOLIC PANEL - Abnormal; Notable for the following components:      Result Value   Chloride 88 (*)    Glucose, Bld 310 (*)    Creatinine, Ser 1.15 (*)    GFR calc non Af Amer 42 (*)    GFR calc Af Amer 49 (*)    Anion gap 18 (*)    All other components within normal limits  BRAIN NATRIURETIC PEPTIDE - Abnormal; Notable for the following components:   B Natriuretic Peptide 356.6 (*)    All other components within normal limits  LACTIC ACID, PLASMA - Abnormal; Notable for the following components:   Lactic Acid, Venous 4.4 (*)    All other components within normal limits  LACTIC ACID, PLASMA - Abnormal; Notable for the following components:   Lactic Acid, Venous 4.1 (*)    All other components within normal limits  CBC WITH DIFFERENTIAL/PLATELET - Abnormal; Notable for the following components:   WBC 10.7 (*)    RDW 15.7 (*)    Neutro Abs 7.9 (*)    Abs Immature Granulocytes 0.09 (*)    All other components within normal limits  POCT I-STAT EG7 -  Abnormal; Notable for the following components:   Bicarbonate 33.5 (*)    TCO2 35 (*)    Acid-Base Excess 7.0 (*)    Sodium 131 (*)    Calcium, Ion 1.02 (*)    Hemoglobin 15.3 (*)    All other components within normal limits  TROPONIN I (HIGH SENSITIVITY) - Abnormal; Notable for the following components:   Troponin I (High Sensitivity) 27 (*)    All other components within normal limits  TROPONIN I (HIGH SENSITIVITY) - Abnormal; Notable for the following components:   Troponin I (High Sensitivity) 50 (*)  All other components within normal limits  SARS CORONAVIRUS 2 BY RT PCR (HOSPITAL ORDER, PERFORMED IN [facility])  CULTURE, BLOOD (ROUTINE X 2)  CULTURE, BLOOD (ROUTINE X 2)  I-STAT VENOUS BLOOD GAS, ED  POC SARS CORONAVIRUS 2 AG -  ED    EKG EKG Interpretation  Date/Time:  Thursday Jan 27 2020 19:01:44 EDT Ventricular Rate:  100 PR Interval:    QRS Duration: 104 QT Interval:  353 QTC Calculation: 456 R Axis:   94 Text Interpretation: Sinus tachycardia Low voltage, precordial leads Consider right ventricular hypertrophy Borderline T abnormalities, diffuse leads rate faster than previous Confirmed by Frederick Peers [id number]) on 01/27/2020 7:23:31 PM   Radiology CT Chest W Contrast  Result Date: 01/27/2020 CLINICAL DATA:  Respiratory illness, nondiagnostic xray. EXAM: CT CHEST WITH CONTRAST TECHNIQUE: Multidetector CT imaging of the chest was performed during intravenous contrast administration. CONTRAST:  29mL OMNIPAQUE IOHEXOL 300 MG/ML  SOLN COMPARISON:  Chest radiograph earlier today. Chest CTA 06/29/2019 FINDINGS: Cardiovascular: Aortic atherosclerosis. No aortic dissection. No central pulmonary embolus to the lobar level. Hiatal hernia displaces the heart anteriorly. Normal heart size. Coronary artery calcifications. Small amount pericardial fluid in the pericardial recesses without significant effusion. Mediastinum/Nodes: Large hiatal hernia, approximately 50%  of the stomach is intrathoracic. Hiatal hernia has increased from prior exam and is fluid-filled. Remainder the esophagus is patulous. No visualized thyroid nodule. Small mediastinal lymph nodes are not enlarged by size criteria. Lungs/Pleura: Elevated right hemidiaphragm with adjacent compressive atelectasis of the right middle and right lower lobes. No dependent airspace disease in the left greater than right lower lobe with air bronchograms. Mild consolidation in the perifissural and dependent right upper lobe. Linear atelectasis in the lingula. No pulmonary edema. No significant pleural fluid. No intraluminal debris within the trachea or mainstem bronchi. Compressive changes in the right middle and lower lobe bronchus related to elevated hemidiaphragm. Upper Abdomen: Peripherally calcified liver lesion in the dome, unchanged. Mild left adrenal thickening not included in the prior field of view. Musculoskeletal: Degenerative change throughout the spine. Mild scoliosis. There are no acute or suspicious osseous abnormalities. There is shunt catheter tubing in the right anterior chest wall. IMPRESSION: 1. Elevated right hemidiaphragm with adjacent compressive atelectasis of the right middle and right lower lobes. Dependent airspace disease in the dependent lungs, left greater than right lower lobe and dependent right upper lobe. This may represent atelectasis or pneumonia, including aspiration, however there is no debris in the trachea or mainstem bronchi. 2. Large hiatal hernia, approximately 50% of the stomach is intrathoracic. This has increased from prior exam and is fluid-filled. Aortic Atherosclerosis (ICD10-I70.0). Electronically Signed   By: Narda Rutherford M.D.   On: 01/27/2020 21:40   DG Chest Port 1 View  Result Date: 01/27/2020 CLINICAL DATA:  Respiratory distress EXAM: PORTABLE CHEST 1 VIEW COMPARISON:  06/29/2019 FINDINGS: Hypoventilatory change with chronic elevation of right diaphragm. Moderate  hiatal hernia slightly increased. Increased left basilar opacity. Subsegmental atelectasis right base. Stable cardiomediastinal silhouette with aortic atherosclerosis. No pneumothorax. Right-sided shunt tubing. IMPRESSION: 1. Low lung volumes with subsegmental atelectasis at the right base. Airspace disease at the left base may reflect atelectasis or possible pneumonia 2. Increased hiatal hernia.  Chronic elevation of right diaphragm Electronically Signed   By: Jasmine Pang M.D.   On: 01/27/2020 19:38    Procedures .Critical Care Performed by: Laurence Spates, MD Authorized by: Laurence Spates, MD   Critical care provider statement:    Critical  care time (minutes):  45   Critical care time was exclusive of:  Separately billable procedures and treating other patients   Critical care was necessary to treat or prevent imminent or life-threatening deterioration of the following conditions:  Respiratory failure   Critical care was time spent personally by me on the following activities:  Development of treatment plan with patient or surrogate, evaluation of patient's response to treatment, examination of patient, obtaining history from patient or surrogate, ordering and performing treatments and interventions, ordering and review of laboratory studies, ordering and review of radiographic studies and re-evaluation of patient's condition   (including critical care time)  Medications Ordered in ED Medications  sodium chloride 0.9 % bolus 400 mL (has no administration in time range)  furosemide (LASIX) injection 40 mg (40 mg Intravenous Given 01/27/20 1918)  cefTRIAXone (ROCEPHIN) 1 g in sodium chloride 0.9 % 100 mL IVPB (0 g Intravenous Stopped 01/27/20 2250)  azithromycin (ZITHROMAX) 500 mg in sodium chloride 0.9 % 250 mL IVPB (0 mg Intravenous Stopped 01/27/20 2318)  iohexol (OMNIPAQUE) 300 MG/ML solution 75 mL (75 mLs Intravenous Contrast Given 01/27/20 2129)  sodium chloride 0.9 % bolus  2,000 mL (2,000 mLs Intravenous New Bag/Given 01/27/20 2216)    ED Course  I have reviewed the triage vital signs and the nursing notes.  Pertinent labs & imaging results that were available during my care of the patient were reviewed by me and considered in my medical decision making (see chart for details).    MDM Rules/Calculators/A&P                      Patient hypoxic and in severe respiratory distress on nonrebreather on arrival by EMS. Rales throughout all lung fields. Initially, her exam and sx seemed suggestive of pulmonary edema, especially given rales, LE edema, and severe HTN. Therefore placed on bipap and started on NTG drip which quickly improved her respiratory distress.  Later, her BP began dropping and NTG was stopped. Labs showed initial lactate of 4, BNP 356, troponin 27-->50.  Initially held on fluid resuscitation given concern for possible pulmonary edema/CHF.  The remainder of her lab work showed creatinine 1.15, WBC 10.7.  COVID-19 negative.  Chest x-ray with atelectasis versus pneumonia in left lung base.  Obtain CT for better evaluation.  Gave ceftriaxone and azithromycin.  CT is more suggestive of aspiration or pneumonia. I then ordered 3ml/kg fluid resuscitation.  Her hypotension has responded to fluids with most recent blood pressure 107 systolic.  Discussed admission with [facility] hospitalist, Dr. Toniann Fail. Pt has DNR paperwork at bedside.  Final Clinical Impression(s) / ED Diagnoses Final diagnoses:  Acute respiratory failure with hypoxia (HCC)  Community acquired pneumonia, unspecified laterality    Rx / DC Orders ED Discharge Orders    None       Arval Brandstetter, Ambrose [place], MD 01/28/20 0004

## 2020-01-27 NOTE — ED Notes (Signed)
Please call daug. With an update  Her name is Susan Landry  [phone]

## 2020-01-27 NOTE — ED Notes (Addendum)
Notified EDP of hypotension (SBP 70s), NS bolus ordered, see MAR

## 2020-01-27 NOTE — ED Notes (Signed)
Nitro titrated to 87mcg/min

## 2020-01-27 NOTE — ED Notes (Signed)
Please call daug. With an update  Her name is paige  [phone] 

## 2020-01-27 NOTE — ED Notes (Signed)
Nitro drip started at 36mcg/hr

## 2020-01-27 NOTE — ED Notes (Signed)
Nitro drip stopped

## 2020-01-27 NOTE — Progress Notes (Signed)
Transported patient to CT and back to 017C without event.

## 2020-01-27 NOTE — ED Triage Notes (Signed)
Pt lives in an independent living facility, has a special care team for dementia but is otherwise alert and conversant normally, Staff returned to pt after briefly leaving room to find her unresponsive with emesis that "looked like water." EMS arrived, started BVM ventilations, able to transition to NRB pta. 50% on RA, 80% on BVM, CO221, RR46, HR 106, 150/90, CBG 277. Rales all fields. H/o CHF.

## 2020-01-28 ENCOUNTER — Encounter (HOSPITAL_COMMUNITY): Payer: Self-pay | Admitting: Internal Medicine

## 2020-01-28 DIAGNOSIS — F0390 Unspecified dementia without behavioral disturbance: Secondary | ICD-10-CM | POA: Diagnosis present

## 2020-01-28 DIAGNOSIS — J9601 Acute respiratory failure with hypoxia: Secondary | ICD-10-CM | POA: Diagnosis present

## 2020-01-28 DIAGNOSIS — A419 Sepsis, unspecified organism: Secondary | ICD-10-CM | POA: Diagnosis present

## 2020-01-28 DIAGNOSIS — Z79899 Other long term (current) drug therapy: Secondary | ICD-10-CM | POA: Diagnosis not present

## 2020-01-28 DIAGNOSIS — I10 Essential (primary) hypertension: Secondary | ICD-10-CM | POA: Diagnosis present

## 2020-01-28 DIAGNOSIS — Z6837 Body mass index (BMI) 37.0-37.9, adult: Secondary | ICD-10-CM | POA: Diagnosis not present

## 2020-01-28 DIAGNOSIS — J440 Chronic obstructive pulmonary disease with acute lower respiratory infection: Secondary | ICD-10-CM | POA: Diagnosis present

## 2020-01-28 DIAGNOSIS — J189 Pneumonia, unspecified organism: Secondary | ICD-10-CM | POA: Diagnosis present

## 2020-01-28 DIAGNOSIS — R6520 Severe sepsis without septic shock: Secondary | ICD-10-CM

## 2020-01-28 DIAGNOSIS — N179 Acute kidney failure, unspecified: Secondary | ICD-10-CM | POA: Diagnosis present

## 2020-01-28 DIAGNOSIS — E1140 Type 2 diabetes mellitus with diabetic neuropathy, unspecified: Secondary | ICD-10-CM | POA: Diagnosis present

## 2020-01-28 DIAGNOSIS — Z96659 Presence of unspecified artificial knee joint: Secondary | ICD-10-CM | POA: Diagnosis present

## 2020-01-28 DIAGNOSIS — Z20822 Contact with and (suspected) exposure to covid-19: Secondary | ICD-10-CM | POA: Diagnosis present

## 2020-01-28 DIAGNOSIS — E1165 Type 2 diabetes mellitus with hyperglycemia: Secondary | ICD-10-CM | POA: Diagnosis present

## 2020-01-28 DIAGNOSIS — Z66 Do not resuscitate: Secondary | ICD-10-CM | POA: Diagnosis present

## 2020-01-28 DIAGNOSIS — Z7951 Long term (current) use of inhaled steroids: Secondary | ICD-10-CM | POA: Diagnosis not present

## 2020-01-28 DIAGNOSIS — R739 Hyperglycemia, unspecified: Secondary | ICD-10-CM | POA: Diagnosis present

## 2020-01-28 LAB — PROCALCITONIN: Procalcitonin: 0.2 ng/mL

## 2020-01-28 LAB — CBC WITH DIFFERENTIAL/PLATELET
Abs Immature Granulocytes: 0.06 10*3/uL (ref 0.00–0.07)
Basophils Absolute: 0.1 10*3/uL (ref 0.0–0.1)
Basophils Relative: 0 %
Eosinophils Absolute: 0.1 10*3/uL (ref 0.0–0.5)
Eosinophils Relative: 1 %
HCT: 36.1 % (ref 36.0–46.0)
Hemoglobin: 11 g/dL — ABNORMAL LOW (ref 12.0–15.0)
Immature Granulocytes: 1 %
Lymphocytes Relative: 11 %
Lymphs Abs: 1.3 10*3/uL (ref 0.7–4.0)
MCH: 29 pg (ref 26.0–34.0)
MCHC: 30.5 g/dL (ref 30.0–36.0)
MCV: 95.3 fL (ref 80.0–100.0)
Monocytes Absolute: 1.1 10*3/uL — ABNORMAL HIGH (ref 0.1–1.0)
Monocytes Relative: 9 %
Neutro Abs: 9.1 10*3/uL — ABNORMAL HIGH (ref 1.7–7.7)
Neutrophils Relative %: 78 %
Platelets: 207 10*3/uL (ref 150–400)
RBC: 3.79 MIL/uL — ABNORMAL LOW (ref 3.87–5.11)
RDW: 15.5 % (ref 11.5–15.5)
WBC: 11.7 10*3/uL — ABNORMAL HIGH (ref 4.0–10.5)
nRBC: 0 % (ref 0.0–0.2)

## 2020-01-28 LAB — COMPREHENSIVE METABOLIC PANEL
ALT: 75 U/L — ABNORMAL HIGH (ref 0–44)
AST: 76 U/L — ABNORMAL HIGH (ref 15–41)
Albumin: 2.8 g/dL — ABNORMAL LOW (ref 3.5–5.0)
Alkaline Phosphatase: 104 U/L (ref 38–126)
Anion gap: 10 (ref 5–15)
BUN: 19 mg/dL (ref 8–23)
CO2: 34 mmol/L — ABNORMAL HIGH (ref 22–32)
Calcium: 8.1 mg/dL — ABNORMAL LOW (ref 8.9–10.3)
Chloride: 98 mmol/L (ref 98–111)
Creatinine, Ser: 0.88 mg/dL (ref 0.44–1.00)
GFR calc Af Amer: >60 mL/min (ref >60)
GFR calc non Af Amer: 58 mL/min — ABNORMAL LOW (ref >60)
Glucose, Bld: 91 mg/dL (ref 70–99)
Potassium: 4.4 mmol/L (ref 3.5–5.1)
Sodium: 142 mmol/L (ref 135–145)
Total Bilirubin: 1 mg/dL (ref 0.3–1.2)
Total Protein: 5.6 g/dL — ABNORMAL LOW (ref 6.5–8.1)

## 2020-01-28 LAB — LACTIC ACID, PLASMA
Lactic Acid, Venous: 1.3 mmol/L (ref 0.5–1.9)
Lactic Acid, Venous: 1.5 mmol/L (ref 0.5–1.9)

## 2020-01-28 LAB — TROPONIN I (HIGH SENSITIVITY)
Troponin I (High Sensitivity): 59 ng/L — ABNORMAL HIGH (ref <18)
Troponin I (High Sensitivity): 54 ng/L — ABNORMAL HIGH (ref <18)

## 2020-01-28 LAB — APTT: aPTT: 37 seconds — ABNORMAL HIGH (ref 24–36)

## 2020-01-28 LAB — PROTIME-INR
INR: 1 (ref 0.8–1.2)
Prothrombin Time: 13.2 seconds (ref 11.4–15.2)

## 2020-01-28 LAB — GLUCOSE, CAPILLARY
Glucose-Capillary: 63 mg/dL — ABNORMAL LOW (ref 70–99)
Glucose-Capillary: 119 mg/dL — ABNORMAL HIGH (ref 70–99)
Glucose-Capillary: 98 mg/dL (ref 70–99)
Glucose-Capillary: 137 mg/dL — ABNORMAL HIGH (ref 70–99)

## 2020-01-28 LAB — HEMOGLOBIN A1C
Hgb A1c MFr Bld: 6.1 % — ABNORMAL HIGH (ref 4.8–5.6)
Mean Plasma Glucose: 128.37 mg/dL

## 2020-01-28 LAB — CBG MONITORING, ED: Glucose-Capillary: 93 mg/dL (ref 70–99)

## 2020-01-28 LAB — MRSA PCR SCREENING: MRSA by PCR: NEGATIVE

## 2020-01-28 LAB — CORTISOL: Cortisol, Plasma: 17.5 ug/dL

## 2020-01-28 MED ORDER — SODIUM CHLORIDE 0.9 % IV BOLUS
1000.0000 mL | Freq: Once | INTRAVENOUS | Status: AC
  Administered 2020-01-28: 1000 mL via INTRAVENOUS

## 2020-01-28 MED ORDER — INSULIN ASPART 100 UNIT/ML ~~LOC~~ SOLN
0.0000 [IU] | SUBCUTANEOUS | Status: DC
  Administered 2020-01-28 – 2020-01-29 (×2): 1 [IU] via SUBCUTANEOUS
  Administered 2020-01-29: 2 [IU] via SUBCUTANEOUS
  Administered 2020-01-31: 1 [IU] via SUBCUTANEOUS

## 2020-01-28 MED ORDER — VANCOMYCIN HCL IN DEXTROSE 1-5 GM/200ML-% IV SOLN
1000.0000 mg | INTRAVENOUS | Status: DC
  Administered 2020-01-29: 1000 mg via INTRAVENOUS
  Filled 2020-01-28: qty 200

## 2020-01-28 MED ORDER — VANCOMYCIN HCL 1750 MG/350ML IV SOLN
1750.0000 mg | Freq: Once | INTRAVENOUS | Status: AC
  Administered 2020-01-28: 1750 mg via INTRAVENOUS
  Filled 2020-01-28: qty 350

## 2020-01-28 MED ORDER — ONDANSETRON HCL 4 MG PO TABS: 4.0000 mg | ORAL_TABLET | Freq: Four times a day (QID) | ORAL | Status: DC | PRN

## 2020-01-28 MED ORDER — ENSURE ENLIVE PO LIQD
237.0000 mL | Freq: Two times a day (BID) | ORAL | Status: DC
  Administered 2020-01-29 – 2020-01-31 (×5): 237 mL via ORAL

## 2020-01-28 MED ORDER — SODIUM CHLORIDE 0.9 % IV SOLN
2.0000 g | INTRAVENOUS | Status: DC
  Administered 2020-01-28 – 2020-01-30 (×3): 2 g via INTRAVENOUS
  Filled 2020-01-28 (×2): qty 20
  Filled 2020-01-28: qty 2
  Filled 2020-01-28: qty 20

## 2020-01-28 MED ORDER — ADULT MULTIVITAMIN W/MINERALS CH
1.0000 | ORAL_TABLET | Freq: Every day | ORAL | Status: DC
  Administered 2020-01-28 – 2020-01-31 (×4): 1 via ORAL
  Filled 2020-01-28 (×4): qty 1

## 2020-01-28 MED ORDER — ENOXAPARIN SODIUM 40 MG/0.4ML ~~LOC~~ SOLN
40.0000 mg | SUBCUTANEOUS | Status: DC
  Administered 2020-01-28 – 2020-01-30 (×3): 40 mg via SUBCUTANEOUS
  Filled 2020-01-28 (×3): qty 0.4

## 2020-01-28 MED ORDER — SODIUM CHLORIDE 0.9 % IV SOLN
500.0000 mg | INTRAVENOUS | Status: DC
  Administered 2020-01-28 – 2020-01-29 (×2): 500 mg via INTRAVENOUS
  Filled 2020-01-28 (×3): qty 500

## 2020-01-28 MED ORDER — DEXTROSE 50 % IV SOLN
12.5000 g | Freq: Once | INTRAVENOUS | Status: AC
  Administered 2020-01-28: 12.5 g via INTRAVENOUS

## 2020-01-28 MED ORDER — SODIUM CHLORIDE 0.9 % IV SOLN: INTRAVENOUS | Status: DC

## 2020-01-28 MED ORDER — DEXTROSE 50 % IV SOLN
INTRAVENOUS | Status: AC
  Filled 2020-01-28: qty 50

## 2020-01-28 MED ORDER — ONDANSETRON HCL 4 MG/2ML IJ SOLN: 4.0000 mg | Freq: Four times a day (QID) | INTRAMUSCULAR | Status: DC | PRN

## 2020-01-28 NOTE — Progress Notes (Signed)
Received from the ED accompanied by RN and RT, as she is on BiPAP.  She is alert at this time and answering some questions except said she is so sleepy and just wants to go to sleep.  Her last MEWS in the ED was red because of her unconsciousness and low BP but now on this unit it is green.  She denies pain.  Did notify X Blount of admission to the floor and also that the MEWS went from red in ED to green on this unit.  Refusing to answer admission questions at this time because she said she wants to go to sleep but did participate in the assessment and allow that to be done.

## 2020-01-28 NOTE — Progress Notes (Signed)
Patient is a little disoriented and does not remember wearing the BIPAP 5/21. States she does wear at home.

## 2020-01-28 NOTE — Progress Notes (Signed)
Pharmacy Antibiotic Note  Susan Landry is a 86 y.o. female admitted on 01/27/2020 with sepsis.  Pharmacy has been consulted for vancomycin dosing.  Presented with respiratory distress, unresponsive with emesis. WBC 10.7, temp 35, LA 4.1. Scr 1.15 today (last Scr in Oct 2020 ~0.7, CrCl ~30 mL/min). Received azithromycin and ceftriaxone.   Plan: Vancomycin 1750 mg IV once then 1000 mg IV every 24 hours  Monitor renal fx, cx results, clinical pic, and levels as needed  Height: 5' (152.4 cm) Weight: 80.3 kg (177 lb) IBW/kg (Calculated) : 45.5  Temp (24hrs), Avg:96.5 F (35.8 C), Min:96.5 F (35.8 C), Max:96.5 F (35.8 C)  Recent Labs  Lab 01/27/20 1854 01/27/20 1940  WBC 10.7*  --   CREATININE 1.15*  --   LATICACIDVEN 4.4* 4.1*    Estimated Creatinine Clearance: 30.5 mL/min (A) (by C-G formula based on SCr of 1.15 mg/dL (H)).    No Known Allergies  Antimicrobials this admission: Ceftriaxone/azithromycin 5/20 x1 Vancomycin 5/20 >>   Dose adjustments this admission: N/A  Microbiology results: 5/20 BCx: sent  5/20 COVID PCR: neg  Thank you for allowing pharmacy to be a part of this patient's care.  Sherron Monday, PharmD, BCCCP Clinical Pharmacist  01/28/2020 2:03 AM  Please check AMION for all [facility] Pharmacy phone numbers After 10:00 PM, call Main Pharmacy [phone]

## 2020-01-28 NOTE — Progress Notes (Signed)
86 yo female admitted with hypoxic resp failure secondary to pneumonia on bipap since last eve..awake alert daughter by the bed side. Continue IV antibiotics consult speech therapy discussed with patient's daughter at bedside

## 2020-01-28 NOTE — Progress Notes (Signed)
Initial Nutrition Assessment  DOCUMENTATION CODES:   Obesity unspecified  INTERVENTION:  Ensure Enlive po BID, each supplement provides 350 kcal and 20 grams of protein  MVI daily  Magic cup TID with meals, each supplement provides 290 kcal and 9 grams of protein   NUTRITION DIAGNOSIS:   Inadequate oral intake related to lethargy/confusion as evidenced by other (comment)(RD observation).    GOAL:   Patient will meet greater than or equal to 90% of their needs    MONITOR:   PO intake, Supplement acceptance, Weight trends, Labs, I & O's  REASON FOR ASSESSMENT:   Low Braden    ASSESSMENT:   Pt with a PMH of dementia, HTN, OA, and possible COPD admitted with sepsis with acute respiratory failure hypoxia.  Pt poor historian and frequently became distracted/disoriented during RD exam. Unable to obtain any relevant information regarding diet/weight history at this time. Assisted pt with meal set up. Pt requiring frequent re-direction.   No PO intake documented.   Labs reviewed. CBGs 93-63-119 Medications reviewed and include: Novolog  NUTRITION - FOCUSED PHYSICAL EXAM:    Most Recent Value  Orbital Region  No depletion  Upper Arm Region  Mild depletion  Thoracic and Lumbar Region  No depletion  Buccal Region  No depletion  Temple Region  Severe depletion  Clavicle Bone Region  No depletion  Clavicle and Acromion Bone Region  No depletion  Scapular Bone Region  No depletion  Dorsal Hand  No depletion  Patellar Region  No depletion  Anterior Thigh Region  No depletion  Posterior Calf Region  No depletion  Edema (RD Assessment)  Mild  Hair  Reviewed  Eyes  Reviewed  Mouth  Reviewed  Skin  Reviewed  Nails  Reviewed       Diet Order:   Diet Order            Diet Carb Modified Fluid consistency: Thin; Room service appropriate? Yes  Diet effective now              EDUCATION NEEDS:   Not appropriate for education at this time  Skin:  Skin  Assessment: Skin Integrity Issues: Skin Integrity Issues:: Other (Comment) Other: MASD perineum, buttocks  Last BM:  5/20  Height:   Ht Readings from Last 1 Encounters:  01/28/20 5' (1.524 m)    Weight:   Wt Readings from Last 3 Encounters:  01/28/20 80.3 kg  07/06/19 80.3 kg  05/05/14 53.9 kg    BMI:  Body mass index is 34.57 kg/m.  Estimated Nutritional Needs:   Kcal:  1400-1600  Protein:  70-85 grams  Fluid:  >/= 1.4 L/d    Eugene Gavia, MS, RD, LDN RD pager number and weekend/on-call pager number located in Amion.

## 2020-01-28 NOTE — H&P (Signed)
History and Physical    Susan Landry:[id number] DOB: [birth year] DOA: 01/27/2020  PCP: Merlene Laughter, MD  Patient coming from: Assisted living facility.  History obtained from ER physician and patient's daughter.  Patient is presently on BiPAP.  Has dementia.  Chief Complaint: Unresponsive episode shortness of breath.  HPI: Susan Landry is a 86 y.o. female with history of dementia and possible COPD was found to be yesterday around noontime suddenly unresponsive with shortness of breath.  Per the report patient staff left the room briefly and came back to see the patient becoming unresponsive EMS was called and patient had to be placed on 100% nonrebreather.  Patient became more alert on the way.  Per patient's daughter patient was talking to the daughter early in the morning.  Had no chest pain no nausea vomiting or diarrhea prior to that.  Was admitted in October 2020 for possible COPD exacerbation.  ED Course: Initially it was thought that patient may have sudden flash pulmonary edema was given Lasix and nitroglycerin was started but following which patient became more hypotensive and CT chest was done which shows features concerning for possible pneumonia.  Patient lactate elevated at around 4.1.  BNP was 356.  Blood glucose 310 creatinine 1.1 Covid test negative WBC 10.7 high-sensitivity troponin was 27 and then 50.  Patient was started on empiric antibiotics for possible sepsis and pneumonia.  Admitted for further management.  Review of Systems: As per HPI, rest all negative.   Past Medical History:  Diagnosis Date  . Cerebral aneurysm   . HYPERTENSION 07/14/2007  . MILD COGNITIVE IMPAIRMENT SO STATED 07/13/2009  . OSTEOARTHRITIS 01/12/2008  . OVERACTIVE BLADDER 07/14/2008    Past Surgical History:  Procedure Laterality Date  . BREAST SURGERY     abcess  . CSF SHUNT     cerebral aneurysm  . TONSILLECTOMY    . TOTAL KNEE ARTHROPLASTY       reports that she has never  smoked. She has never used smokeless tobacco. She reports current alcohol use. She reports that she does not use drugs.  No Known Allergies  Family History  Problem Relation Age of Onset  . Cancer Father     Prior to Admission medications   Medication Sig Start Date End Date Taking? Authorizing Provider  albuterol (VENTOLIN HFA) 108 (90 Base) MCG/ACT inhaler Inhale 2 puffs into the lungs every 6 (six) hours as needed for wheezing or shortness of breath. 07/06/19  Yes [place], Eric J, DO  Calcium 600-200 MG-UNIT tablet Take 1 tablet by mouth daily.   Yes [provider]  donepezil (ARICEPT) 10 MG tablet Take 1 tablet (10 mg total) by mouth at bedtime. 07/06/19 01/27/20 Yes [place], Eric J, DO  fluticasone furoate-vilanterol (BREO ELLIPTA) 100-25 MCG/INH AEPB Inhale 1 puff into the lungs daily. 07/06/19  Yes [place], Eric J, DO  furosemide (LASIX) 40 MG tablet Take 1 tablet (40 mg total) by mouth 2 (two) times daily. 07/06/19 01/27/20 Yes [place], Alvira Philips, DO  Multiple Vitamin (MULTIVITAMIN) tablet Take 1 tablet by mouth at bedtime.    Yes [provider]  Multiple Vitamins-Minerals (PRESERVISION AREDS 2 PO) Take 1 tablet by mouth 2 (two) times daily.   Yes [provider]  potassium chloride SA (KLOR-CON) 10 MEQ tablet Take 1 tablet (10 mEq total) by mouth daily. 07/06/19 01/27/20 Yes [place], Eric J, DO  tiotropium (SPIRIVA HANDIHALER) 18 MCG inhalation capsule Place 1 capsule (18 mcg total) into inhaler and inhale  daily. 07/06/19 01/27/20 Yes [place], Alvira Philips, DO    Physical Exam: Constitutional: Moderately built and nourished. Vitals:   01/28/20 0015 01/28/20 0030 01/28/20 0100 01/28/20 0145  BP: (!) 116/99 100/73  112/72  Pulse: 81 81  80  Resp: 19 19  14   Temp:      TempSrc:      SpO2: 95% 94%  99%  Weight:   80.3 kg   Height:   5' (1.524 m)    Eyes: Anicteric no pallor. ENMT: No discharge from the ears eyes nose or mouth. Neck: No mass felt.  No neck  rigidity. Respiratory: No rhonchi or crepitations. Cardiovascular: S1-S2 heard. Abdomen: Soft nontender bowel sounds present. Musculoskeletal: No edema. Skin: No rash. Neurologic: Patient is communicating with her daughter but is on BiPAP.  Moving all extremities. Psychiatric: Presently on BiPAP difficult assess.   Labs on Admission: I have personally reviewed following labs and imaging studies  CBC: Recent Labs  Lab 01/27/20 1854 01/27/20 1903  WBC 10.7*  --   NEUTROABS 7.9*  --   HGB 13.6 15.3*  HCT 43.9 45.0  MCV 94.0  --   PLT 271  --    Basic Metabolic Panel: Recent Labs  Lab 01/27/20 1854 01/27/20 1903  NA 136 131*  K 4.6 4.2  CL 88*  --   CO2 30  --   GLUCOSE 310*  --   BUN 22  --   CREATININE 1.15*  --   CALCIUM 9.9  --    GFR: Estimated Creatinine Clearance: 30.5 mL/min (A) (by C-G formula based on SCr of 1.15 mg/dL (H)). Liver Function Tests: Recent Labs  Lab 01/27/20 1854  AST 33  ALT 23  ALKPHOS 117  BILITOT 0.9  PROT 7.5  ALBUMIN 3.9   No results for input(s): LIPASE, AMYLASE in the last 168 hours. No results for input(s): AMMONIA in the last 168 hours. Coagulation Profile: No results for input(s): INR, PROTIME in the last 168 hours. Cardiac Enzymes: No results for input(s): CKTOTAL, CKMB, CKMBINDEX, TROPONINI in the last 168 hours. BNP (last 3 results) No results for input(s): PROBNP in the last 8760 hours. HbA1C: No results for input(s): HGBA1C in the last 72 hours. CBG: No results for input(s): GLUCAP in the last 168 hours. Lipid Profile: No results for input(s): CHOL, HDL, LDLCALC, TRIG, CHOLHDL, LDLDIRECT in the last 72 hours. Thyroid Function Tests: No results for input(s): TSH, T4TOTAL, FREET4, T3FREE, THYROIDAB in the last 72 hours. Anemia Panel: No results for input(s): VITAMINB12, FOLATE, FERRITIN, TIBC, IRON, RETICCTPCT in the last 72 hours. Urine analysis:    Component Value Date/Time   COLORURINE YELLOW 05/04/2014 1055     APPEARANCEUR CLOUDY (A) 05/04/2014 1055   LABSPEC 1.009 05/04/2014 1055   PHURINE 8.0 05/04/2014 1055   GLUCOSEU NEGATIVE 05/04/2014 1055   HGBUR NEGATIVE 05/04/2014 1055   HGBUR negative 01/04/2010 0808   BILIRUBINUR NEGATIVE 05/04/2014 1055   BILIRUBINUR n 07/16/2011 1320   KETONESUR NEGATIVE 05/04/2014 1055   PROTEINUR NEGATIVE 05/04/2014 1055   UROBILINOGEN 0.2 05/04/2014 1055   NITRITE NEGATIVE 05/04/2014 1055   LEUKOCYTESUR SMALL (A) 05/04/2014 1055   Sepsis Labs: @LABRCNTIP (procalcitonin:4,lacticidven:4) ) Recent Results (from the past 240 hour(s))  SARS Coronavirus 2 by RT PCR (hospital order, performed in [facility] hospital lab) Nasopharyngeal Nasopharyngeal Swab     Status: None   Collection Time: 01/27/20  7:45 PM   Specimen: Nasopharyngeal Swab  Result Value Ref Range Status   SARS Coronavirus  2 NEGATIVE NEGATIVE Final    Comment: (NOTE) SARS-CoV-2 target nucleic acids are NOT DETECTED. The SARS-CoV-2 RNA is generally detectable in upper and lower respiratory specimens during the acute phase of infection. The lowest concentration of SARS-CoV-2 viral copies this assay can detect is 250 copies / mL. A negative result does not preclude SARS-CoV-2 infection and should not be used as the sole basis for treatment or other patient management decisions.  A negative result may occur with improper specimen collection / handling, submission of specimen other than nasopharyngeal swab, presence of viral mutation(s) within the areas targeted by this assay, and inadequate number of viral copies (<250 copies / mL). A negative result must be combined with clinical observations, patient history, and epidemiological information. Fact Sheet for Patients:   BoilerBrush.com.cy Fact Sheet for Healthcare Providers: https://pope.com/ This test is not yet approved or cleared  by the [place] FDA and has been authorized for detection  and/or diagnosis of SARS-CoV-2 by FDA under an Emergency Use Authorization (EUA).  This EUA will remain in effect (meaning this test can be used) for the duration of the COVID-19 declaration under Section 564(b)(1) of the Act, 21 U.S.C. section 360bbb-3(b)(1), unless the authorization is terminated or revoked sooner. Performed at [facility], [address], [place], [place] [address]      Radiological Exams on Admission: CT Chest W Contrast  Result Date: 01/27/2020 CLINICAL DATA:  Respiratory illness, nondiagnostic xray. EXAM: CT CHEST WITH CONTRAST TECHNIQUE: Multidetector CT imaging of the chest was performed during intravenous contrast administration. CONTRAST:  76mL OMNIPAQUE IOHEXOL 300 MG/ML  SOLN COMPARISON:  Chest radiograph earlier today. Chest CTA 06/29/2019 FINDINGS: Cardiovascular: Aortic atherosclerosis. No aortic dissection. No central pulmonary embolus to the lobar level. Hiatal hernia displaces the heart anteriorly. Normal heart size. Coronary artery calcifications. Small amount pericardial fluid in the pericardial recesses without significant effusion. Mediastinum/Nodes: Large hiatal hernia, approximately 50% of the stomach is intrathoracic. Hiatal hernia has increased from prior exam and is fluid-filled. Remainder the esophagus is patulous. No visualized thyroid nodule. Small mediastinal lymph nodes are not enlarged by size criteria. Lungs/Pleura: Elevated right hemidiaphragm with adjacent compressive atelectasis of the right middle and right lower lobes. No dependent airspace disease in the left greater than right lower lobe with air bronchograms. Mild consolidation in the perifissural and dependent right upper lobe. Linear atelectasis in the lingula. No pulmonary edema. No significant pleural fluid. No intraluminal debris within the trachea or mainstem bronchi. Compressive changes in the right middle and lower lobe bronchus related to elevated hemidiaphragm. Upper Abdomen:  Peripherally calcified liver lesion in the dome, unchanged. Mild left adrenal thickening not included in the prior field of view. Musculoskeletal: Degenerative change throughout the spine. Mild scoliosis. There are no acute or suspicious osseous abnormalities. There is shunt catheter tubing in the right anterior chest wall. IMPRESSION: 1. Elevated right hemidiaphragm with adjacent compressive atelectasis of the right middle and right lower lobes. Dependent airspace disease in the dependent lungs, left greater than right lower lobe and dependent right upper lobe. This may represent atelectasis or pneumonia, including aspiration, however there is no debris in the trachea or mainstem bronchi. 2. Large hiatal hernia, approximately 50% of the stomach is intrathoracic. This has increased from prior exam and is fluid-filled. Aortic Atherosclerosis (ICD10-I70.0). Electronically Signed   By: Narda Rutherford M.D.   On: 01/27/2020 21:40   DG Chest Port 1 View  Result Date: 01/27/2020 CLINICAL DATA:  Respiratory distress EXAM: PORTABLE CHEST 1  VIEW COMPARISON:  06/29/2019 FINDINGS: Hypoventilatory change with chronic elevation of right diaphragm. Moderate hiatal hernia slightly increased. Increased left basilar opacity. Subsegmental atelectasis right base. Stable cardiomediastinal silhouette with aortic atherosclerosis. No pneumothorax. Right-sided shunt tubing. IMPRESSION: 1. Low lung volumes with subsegmental atelectasis at the right base. Airspace disease at the left base may reflect atelectasis or possible pneumonia 2. Increased hiatal hernia.  Chronic elevation of right diaphragm Electronically Signed   By: Donavan Foil M.D.   On: 01/27/2020 19:38    EKG: Independently reviewed.  Sinus tachycardia RBBB.  Assessment/Plan Principal Problem:   Sepsis ([place]) Active Problems:   Essential hypertension   Acute respiratory failure with hypoxia (HCC)   ARF (acute renal failure) (HCC)   Hyperglycemia    1. Sepsis  with acute respiratory failure hypoxia could be from pneumonia which could also be from aspiration pneumonia.  Patient is on BiPAP and on empiric antibiotics follow cultures continue hydration follow lactate procalcitonin levels. 2. History of COPD we will keep patient on nebulizer treatment. 3. History of dementia.  We will need to further reassess patient's neurological status when patient is more alert awake and off the BiPAP.  Since patient has septic picture on presentation will need close monitoring and inpatient status.   DVT prophylaxis: Lovenox. Code Status: DNR confirmed with patient's daughter. Family Communication: Patient's daughter. Disposition Plan: Back to facility when stable. Consults called: None. Admission status: Inpatient.   Rise Patience MD [facility] Pager [phone].  If 7PM-7AM, please contact night-coverage [email or web address] Password [email or web address]  01/28/2020, 2:20 AM

## 2020-01-29 LAB — GLUCOSE, CAPILLARY
Glucose-Capillary: 142 mg/dL — ABNORMAL HIGH (ref 70–99)
Glucose-Capillary: 148 mg/dL — ABNORMAL HIGH (ref 70–99)
Glucose-Capillary: 73 mg/dL (ref 70–99)
Glucose-Capillary: 76 mg/dL (ref 70–99)
Glucose-Capillary: 83 mg/dL (ref 70–99)
Glucose-Capillary: 96 mg/dL (ref 70–99)
Glucose-Capillary: 97 mg/dL (ref 70–99)

## 2020-01-29 MED ORDER — POLYETHYLENE GLYCOL 3350 17 G PO PACK
17.0000 g | PACK | Freq: Every day | ORAL | Status: DC
  Administered 2020-01-29 – 2020-01-30 (×2): 17 g via ORAL
  Filled 2020-01-29 (×2): qty 1

## 2020-01-29 MED ORDER — FUROSEMIDE 10 MG/ML IJ SOLN
40.0000 mg | Freq: Two times a day (BID) | INTRAMUSCULAR | Status: AC
  Administered 2020-01-29 – 2020-01-30 (×2): 40 mg via INTRAVENOUS
  Filled 2020-01-29 (×2): qty 4

## 2020-01-29 MED ORDER — FLUTICASONE FUROATE-VILANTEROL 100-25 MCG/INH IN AEPB
1.0000 | INHALATION_SPRAY | Freq: Every day | RESPIRATORY_TRACT | Status: DC
  Administered 2020-01-29 – 2020-01-31 (×3): 1 via RESPIRATORY_TRACT
  Filled 2020-01-29: qty 28

## 2020-01-29 MED ORDER — FUROSEMIDE 10 MG/ML IJ SOLN
40.0000 mg | Freq: Once | INTRAMUSCULAR | Status: AC
  Administered 2020-01-29: 40 mg via INTRAVENOUS
  Filled 2020-01-29: qty 4

## 2020-01-29 MED ORDER — PANTOPRAZOLE SODIUM 40 MG PO TBEC
40.0000 mg | DELAYED_RELEASE_TABLET | Freq: Every day | ORAL | Status: DC
  Administered 2020-01-29 – 2020-01-31 (×3): 40 mg via ORAL
  Filled 2020-01-29 (×3): qty 1

## 2020-01-29 MED ORDER — UMECLIDINIUM BROMIDE 62.5 MCG/INH IN AEPB
1.0000 | INHALATION_SPRAY | Freq: Every day | RESPIRATORY_TRACT | Status: DC
  Administered 2020-01-29 – 2020-01-31 (×3): 1 via RESPIRATORY_TRACT
  Filled 2020-01-29: qty 7

## 2020-01-29 NOTE — Progress Notes (Signed)
PROGRESS NOTE    Susan Landry  ZJI:[id number] DOB: [birth year] DOA: 01/27/2020 PCP: Merlene Laughter, MD   Brief Narrative:86 y.o. female with history of dementia and possible COPD was found to be yesterday around noontime suddenly unresponsive with shortness of breath.  Per the report patient staff left the room briefly and came back to see the patient becoming unresponsive EMS was called and patient had to be placed on 100% nonrebreather.  Patient became more alert on the way.  Per patient's daughter patient was talking to the daughter early in the morning.  Had no chest pain no nausea vomiting or diarrhea prior to that.  Was admitted in October 2020 for possible COPD exacerbation.  ED Course: Initially it was thought that patient may have sudden flash pulmonary edema was given Lasix and nitroglycerin was started but following which patient became more hypotensive and CT chest was done which shows features concerning for possible pneumonia.  Patient lactate elevated at around 4.1.  BNP was 356.  Blood glucose 310 creatinine 1.1 Covid test negative WBC 10.7 high-sensitivity troponin was 27 and then 50.  Patient was started on empiric antibiotics for possible sepsis and pneumonia.  Admitted for further management.  Assessment & Plan:   Principal Problem:   Sepsis (HCC) Active Problems:   Essential hypertension   Acute respiratory failure with hypoxia (HCC)   ARF (acute renal failure) (HCC)   Hyperglycemia    #1 acute hypoxic respiratory failure secondary to pneumonia-patient has been on and off BiPAP. She reports feeling better. Continue antibiotics. Appreciate speech input. Lasix 40 mg twice a day restarted Positive by 1.7 L Patient's daughter told me that that she has a MOST form that says do not hospitalize and DO NOT RESUSCITATE and no IV fluids however the facilities keeps bringing her or calling EMS when patient gets hypoxic.  Will have a direct care hospice talk to her.  And  consult social Investment banker, operational for discharge planning.  #2 history of dementia on Aricept.  #3 history of COPD stable restart Spiriva and Breo Ellipta and albuterol.   Nutrition Problem: Inadequate oral intake Etiology: lethargy/confusion   Signs/Symptoms: other (comment)(RD observation)    Interventions: Ensure Enlive (each supplement provides 350kcal and 20 grams of protein), MVI, Magic cup  Estimated body mass index is 37.37 kg/m as calculated from the following:   Height as of this encounter: 5' (1.524 m).   Weight as of this encounter: 86.8 kg.  DVT prophylaxis: Lovenox  code Status: DO NOT RESUSCITATE  family Communication: Discussed with patient's daughter Misty Stanley  disposition Plan:  Status is: Inpatient  Dispo: The patient is from nursing home              Anticipated d/c is to: Nursing home              Anticipated d/c date is: 48 hours              Patient currently is not medically stable to d/c.  Admitted with acute hypoxic respiratory failure secondary to pneumonia   Consultants: None  Procedures: None Antimicrobials:  Rocephin and Flagyl Subjective: Patient resting in bed earlier when I saw her she was off of her BiPAP she was awake and alert and was feeling better But later she started to get hypoxic ANO and was placed back on BiPAP again she was given Lasix IV fluids were stopped Objective: Vitals:   01/29/20 0458 01/29/20 0500 01/29/20 0801 01/29/20 1128  BP: 116/79  Pulse: 60  (!) 58   Resp: 18  18   Temp: (!) 97.5 F (36.4 C)     TempSrc: Other (Comment)     SpO2:   98% 93%  Weight:  86.8 kg    Height:        Intake/Output Summary (Last 24 hours) at 01/29/2020 1456 Last data filed at 01/29/2020 0454 Gross per 24 hour  Intake --  Output 1700 ml  Net -1700 ml   Filed Weights   01/28/20 0100 01/29/20 0500  Weight: 80.3 kg 86.8 kg    Examination:  General exam: Appears calm and comfortable  Respiratory system: Crackles bilateral  bases to auscultation. Respiratory effort normal. Cardiovascular system: S1 & S2 heard, RRR. No JVD, murmurs, rubs, gallops or clicks. No pedal edema. Gastrointestinal system: Abdomen is nondistended, soft and nontender. No organomegaly or masses felt. Normal bowel sounds heard. Central nervous system: Alert and oriented. No focal neurological deficits. Extremities 1+ pitting edema bilaterally  skin: No rashes, lesions or ulcers Psychiatry: Judgement and insight appear normal. Mood & affect appropriate.     Data Reviewed: I have personally reviewed following labs and imaging studies  CBC: Recent Labs  Lab 01/27/20 1854 01/27/20 1903 01/28/20 0436  WBC 10.7*  --  11.7*  NEUTROABS 7.9*  --  9.1*  HGB 13.6 15.3* 11.0*  HCT 43.9 45.0 36.1  MCV 94.0  --  95.3  PLT 271  --  207   Basic Metabolic Panel: Recent Labs  Lab 01/27/20 1854 01/27/20 1903 01/28/20 0436  NA 136 131* 142  K 4.6 4.2 4.4  CL 88*  --  98  CO2 30  --  34*  GLUCOSE 310*  --  91  BUN 22  --  19  CREATININE 1.15*  --  0.88  CALCIUM 9.9  --  8.1*   GFR: Estimated Creatinine Clearance: 41.6 mL/min (by C-G formula based on SCr of 0.88 mg/dL). Liver Function Tests: Recent Labs  Lab 01/27/20 1854 01/28/20 0436  AST 33 76*  ALT 23 75*  ALKPHOS 117 104  BILITOT 0.9 1.0  PROT 7.5 5.6*  ALBUMIN 3.9 2.8*   No results for input(s): LIPASE, AMYLASE in the last 168 hours. No results for input(s): AMMONIA in the last 168 hours. Coagulation Profile: Recent Labs  Lab 01/28/20 0436  INR 1.0   Cardiac Enzymes: No results for input(s): CKTOTAL, CKMB, CKMBINDEX, TROPONINI in the last 168 hours. BNP (last 3 results) No results for input(s): PROBNP in the last 8760 hours. HbA1C: Recent Labs    01/28/20 0436  HGBA1C 6.1*   CBG: Recent Labs  Lab 01/28/20 2102 01/29/20 0028 01/29/20 0410 01/29/20 0801 01/29/20 1120  GLUCAP 137* 96 83 76 148*   Lipid Profile: No results for input(s): CHOL, HDL,  LDLCALC, TRIG, CHOLHDL, LDLDIRECT in the last 72 hours. Thyroid Function Tests: No results for input(s): TSH, T4TOTAL, FREET4, T3FREE, THYROIDAB in the last 72 hours. Anemia Panel: No results for input(s): VITAMINB12, FOLATE, FERRITIN, TIBC, IRON, RETICCTPCT in the last 72 hours. Sepsis Labs: Recent Labs  Lab 01/27/20 1854 01/27/20 1940 01/28/20 0436 01/28/20 0644  PROCALCITON  --   --  0.20  --   LATICACIDVEN 4.4* 4.1* 1.3 1.5    Recent Results (from the past 240 hour(s))  SARS Coronavirus 2 by RT PCR (hospital order, performed in [facility] hospital lab) Nasopharyngeal Nasopharyngeal Swab     Status: None   Collection Time: 01/27/20  7:45 PM   Specimen:  Nasopharyngeal Swab  Result Value Ref Range Status   SARS Coronavirus 2 NEGATIVE NEGATIVE Final    Comment: (NOTE) SARS-CoV-2 target nucleic acids are NOT DETECTED. The SARS-CoV-2 RNA is generally detectable in upper and lower respiratory specimens during the acute phase of infection. The lowest concentration of SARS-CoV-2 viral copies this assay can detect is 250 copies / mL. A negative result does not preclude SARS-CoV-2 infection and should not be used as the sole basis for treatment or other patient management decisions.  A negative result may occur with improper specimen collection / handling, submission of specimen other than nasopharyngeal swab, presence of viral mutation(s) within the areas targeted by this assay, and inadequate number of viral copies (<250 copies / mL). A negative result must be combined with clinical observations, patient history, and epidemiological information. Fact Sheet for Patients:   StrictlyIdeas.no Fact Sheet for Healthcare Providers: BankingDealers.co.za This test is not yet approved or cleared  by the [place] FDA and has been authorized for detection and/or diagnosis of SARS-CoV-2 by FDA under an Emergency Use Authorization (EUA).  This  EUA will remain in effect (meaning this test can be used) for the duration of the COVID-19 declaration under Section 564(b)(1) of the Act, 21 U.S.C. section 360bbb-3(b)(1), unless the authorization is terminated or revoked sooner. Performed at [facility], [place] [address] [place], [place] [address]   Culture, blood (routine x 2)     Status: None (Preliminary result)   Collection Time: 01/27/20  9:25 PM   Specimen: BLOOD  Result Value Ref Range Status   Specimen Description BLOOD RIGHT HAND  Final   Special Requests   Final    BOTTLES DRAWN AEROBIC AND ANAEROBIC Blood Culture adequate volume   Culture   Final    NO GROWTH 2 DAYS Performed at [facility], [place] [address], [place], [place] [address]    Report Status PENDING  Incomplete  Culture, blood (routine x 2)     Status: None (Preliminary result)   Collection Time: 01/27/20  9:30 PM   Specimen: BLOOD  Result Value Ref Range Status   Specimen Description BLOOD RIGHT ARM  Final   Special Requests   Final    BOTTLES DRAWN AEROBIC AND ANAEROBIC Blood Culture results may not be optimal due to an inadequate volume of blood received in culture bottles   Culture   Final    NO GROWTH 2 DAYS Performed at [facility], [place] [address], [place], [place] [address]    Report Status PENDING  Incomplete  MRSA PCR Screening     Status: None   Collection Time: 01/28/20  7:41 PM   Specimen: Nasal Mucosa; Nasopharyngeal  Result Value Ref Range Status   MRSA by PCR NEGATIVE NEGATIVE Final    Comment:        The GeneXpert MRSA Assay (FDA approved for NASAL specimens only), is one component of a comprehensive MRSA colonization surveillance program. It is not intended to diagnose MRSA infection nor to guide or monitor treatment for MRSA infections. Performed at [facility], [place] [address], [place], [place] [address]          Radiology Studies: CT Chest W Contrast  Result Date: 01/27/2020 CLINICAL DATA:   Respiratory illness, nondiagnostic xray. EXAM: CT CHEST WITH CONTRAST TECHNIQUE: Multidetector CT imaging of the chest was performed during intravenous contrast administration. CONTRAST:  17mL OMNIPAQUE IOHEXOL 300 MG/ML  SOLN COMPARISON:  Chest radiograph earlier today. Chest  CTA 06/29/2019 FINDINGS: Cardiovascular: Aortic atherosclerosis. No aortic dissection. No central pulmonary embolus to the lobar level. Hiatal hernia displaces the heart anteriorly. Normal heart size. Coronary artery calcifications. Small amount pericardial fluid in the pericardial recesses without significant effusion. Mediastinum/Nodes: Large hiatal hernia, approximately 50% of the stomach is intrathoracic. Hiatal hernia has increased from prior exam and is fluid-filled. Remainder the esophagus is patulous. No visualized thyroid nodule. Small mediastinal lymph nodes are not enlarged by size criteria. Lungs/Pleura: Elevated right hemidiaphragm with adjacent compressive atelectasis of the right middle and right lower lobes. No dependent airspace disease in the left greater than right lower lobe with air bronchograms. Mild consolidation in the perifissural and dependent right upper lobe. Linear atelectasis in the lingula. No pulmonary edema. No significant pleural fluid. No intraluminal debris within the trachea or mainstem bronchi. Compressive changes in the right middle and lower lobe bronchus related to elevated hemidiaphragm. Upper Abdomen: Peripherally calcified liver lesion in the dome, unchanged. Mild left adrenal thickening not included in the prior field of view. Musculoskeletal: Degenerative change throughout the spine. Mild scoliosis. There are no acute or suspicious osseous abnormalities. There is shunt catheter tubing in the right anterior chest wall. IMPRESSION: 1. Elevated right hemidiaphragm with adjacent compressive atelectasis of the right middle and right lower lobes. Dependent airspace disease in the dependent lungs, left  greater than right lower lobe and dependent right upper lobe. This may represent atelectasis or pneumonia, including aspiration, however there is no debris in the trachea or mainstem bronchi. 2. Large hiatal hernia, approximately 50% of the stomach is intrathoracic. This has increased from prior exam and is fluid-filled. Aortic Atherosclerosis (ICD10-I70.0). Electronically Signed   By: Narda Rutherford M.D.   On: 01/27/2020 21:40   DG Chest Port 1 View  Result Date: 01/27/2020 CLINICAL DATA:  Respiratory distress EXAM: PORTABLE CHEST 1 VIEW COMPARISON:  06/29/2019 FINDINGS: Hypoventilatory change with chronic elevation of right diaphragm. Moderate hiatal hernia slightly increased. Increased left basilar opacity. Subsegmental atelectasis right base. Stable cardiomediastinal silhouette with aortic atherosclerosis. No pneumothorax. Right-sided shunt tubing. IMPRESSION: 1. Low lung volumes with subsegmental atelectasis at the right base. Airspace disease at the left base may reflect atelectasis or possible pneumonia 2. Increased hiatal hernia.  Chronic elevation of right diaphragm Electronically Signed   By: Jasmine Pang M.D.   On: 01/27/2020 19:38        Scheduled Meds: . enoxaparin (LOVENOX) injection  40 mg Subcutaneous Q24H  . feeding supplement (ENSURE ENLIVE)  237 mL Oral BID BM  . fluticasone furoate-vilanterol  1 puff Inhalation Daily  . furosemide  40 mg Intravenous Q12H  . insulin aspart  0-9 Units Subcutaneous Q4H  . multivitamin with minerals  1 tablet Oral Daily  . pantoprazole  40 mg Oral Daily  . polyethylene glycol  17 g Oral Daily  . umeclidinium bromide  1 puff Inhalation Daily   Continuous Infusions: . azithromycin 500 mg (01/28/20 2204)  . cefTRIAXone (ROCEPHIN)  IV 2 g (01/28/20 2118)     LOS: 1 day     Alwyn Ren, MD 01/29/2020, 2:56 PM

## 2020-01-29 NOTE — Evaluation (Signed)
Clinical/Bedside Swallow Evaluation Patient Details  Name: Susan Landry MRN: [id number] Date of Birth: 1927-11-19  Today's Date: 01/29/2020 Time: SLP Start Time (ACUTE ONLY): 0940 SLP Stop Time (ACUTE ONLY): 1023 SLP Time Calculation (min) (ACUTE ONLY): 43 min  Past Medical History:  Past Medical History:  Diagnosis Date  . Cerebral aneurysm   . HYPERTENSION 07/14/2007  . MILD COGNITIVE IMPAIRMENT SO STATED 07/13/2009  . OSTEOARTHRITIS 01/12/2008  . OVERACTIVE BLADDER 07/14/2008   Past Surgical History:  Past Surgical History:  Procedure Laterality Date  . BREAST SURGERY     abcess  . CSF SHUNT     cerebral aneurysm  . TONSILLECTOMY    . TOTAL KNEE ARTHROPLASTY     HPI:  Susan Landry is a 86 y.o. female with history of dementia and possible COPD was found to be suddenly unresponsive with shortness of breath.  Per the report patient staff left the room briefly and came back to see the patient becoming unresponsive.  EMS was called and patient had to be placed on 100% nonrebreather.  Patient became more alert on the way. Chest CT reported concerns for atelectasis vs PNA, possibly aspiration PNA.   Assessment / Plan / Recommendation Clinical Impression  Pt was seen for a bedside swallow evaluation and she presents with suspected oropharyngeal dysphagia with a possible esophageal component.  Pt's daughter and the RN reported that the pt had been tolerating the nasal cannula (3L) this morning up until breakfast.  Daughter reported coughing with solid intake followed by shallow breathing.  BiPAP was then placed up until this evaluation.  RN switched pt to nasal cannula for this evaluation.  Pt's daughter reported that the pt has occasional coughing with liquid intake at baseline, but she stated that she is generally able to tolerate regular solids and thin liquid without much difficulty.  Upon SLP arrival, pt was noted to be short of breath and she had a baseline congested/wet cough.  Cough  was noted to be weak and she had difficulty mobilizing sputum to her oral cavity to be suctioned.  RN reported that pt was going to be started on Lasix soon secondary to concerns for fluid overload.  Oral mechanism exam was unremarkable.  Pt consumed trials of thin liquid, nectar-thick liquid, honey-thick liquid, puree, and regular solids.   She exhibited multiple swallows per bolus with all trials, particularly with large straw sips of liquids.  Pt initially tolerated thin liquid and puree without difficulty, but as trials progressed, she began to exhibit delayed throat clearing and coughing with all trials.  Eructation was observed following most trials with a subsequent throat clear or cough.  Suspect esophageal dysfunction.  Pt may benefit from a PPI.  Immediate coughing was noted following regular solid trials.  Recommend an instrumental swallow study to further evalaute swallow function and to help determine the safest/least restrictive diet.  Tried to schedule a MBS this afternoon, but radiology was already full.  Will plan for MBS tomorrow.  Until MBS is completed, recommend small snacks of Dysphagia 1 (puree) solids and thin water via tsp only with medications administered crushed in puree.    SLP Visit Diagnosis: Dysphagia, unspecified (R13.10)    Aspiration Risk  Moderate aspiration risk    Diet Recommendation Dysphagia 1 (Puree);Thin liquid   Liquid Administration via: Spoon Medication Administration: Crushed with puree Supervision: Staff to assist with self feeding;Full supervision/cueing for compensatory strategies Compensations: Minimize environmental distractions;Slow rate;Small sips/bites Postural Changes: Seated upright at 90 degrees;Remain  upright for at least 30 minutes after po intake    Other  Recommendations Oral Care Recommendations: Oral care BID;Staff/trained caregiver to provide oral care Other Recommendations: Have oral suction available   Follow up Recommendations 24  hour supervision/assistance      Frequency and Duration min 2x/week  2 weeks       Prognosis Prognosis for Safe Diet Advancement: Fair      Swallow Study   General HPI: Susan Landry is a 86 y.o. female with history of dementia and possible COPD was found to be suddenly unresponsive with shortness of breath.  Per the report patient staff left the room briefly and came back to see the patient becoming unresponsive.  EMS was called and patient had to be placed on 100% nonrebreather.  Patient became more alert on the way. Chest CT reported concerns for atelectasis vs PNA, possibly aspiration PNA. Type of Study: Bedside Swallow Evaluation Previous Swallow Assessment: None Diet Prior to this Study: Regular;Thin liquids Temperature Spikes Noted: No Respiratory Status: Nasal cannula;Other (comment)(Bipap upon arrival ) History of Recent Intubation: No Behavior/Cognition: Alert;Cooperative;Pleasant mood Oral Cavity Assessment: Within Functional Limits Oral Care Completed by SLP: No Oral Cavity - Dentition: Adequate natural dentition Vision: Functional for self-feeding Self-Feeding Abilities: Needs assist Patient Positioning: Upright in bed Baseline Vocal Quality: Low vocal intensity Volitional Cough: Weak;Congested Volitional Swallow: Able to elicit    Oral/Motor/Sensory Function Overall Oral Motor/Sensory Function: Within functional limits   Ice Chips Ice chips: Within functional limits Presentation: Spoon   Thin Liquid Thin Liquid: Impaired Presentation: Spoon;Straw;Cup Pharyngeal  Phase Impairments: Suspected delayed Swallow;Multiple swallows;Cough - Delayed;Throat Clearing - Delayed    Nectar Thick Nectar Thick Liquid: Impaired Presentation: Straw;Spoon Pharyngeal Phase Impairments: Throat Clearing - Delayed;Multiple swallows   Honey Thick Honey Thick Liquid: Impaired Presentation: Spoon;Straw Pharyngeal Phase Impairments: Cough - Delayed;Throat Clearing - Delayed;Multiple  swallows   Puree Puree: Impaired Presentation: Spoon Pharyngeal Phase Impairments: Multiple swallows;Throat Clearing - Delayed   Solid     Solid: Impaired Presentation: Spoon;Self Fed Oral Phase Functional Implications: Prolonged oral transit;Oral residue Pharyngeal Phase Impairments: Multiple swallows;Cough - Immediate     Villa Herb M.S., CCC-SLP Acute Rehabilitation Services Office: [phone]  Shanon Rosser Hessie Varone 01/29/2020,10:50 AM

## 2020-01-29 NOTE — Social Work (Addendum)
CSW contacted by RN to speak with patient and patient's daughter POA Lattie Haw. CSW met with patient and daughter bedside. Daughter expressed she would like for patient to return to [facility] and believes hospice services should be considered. CSW allowed daughter to express herself. Information was relayed to [facility] and MD by CM. Patient is already active with [facility] and they are actively a part of care planning with patient and family.  [employer], LCSWA

## 2020-01-29 NOTE — Progress Notes (Signed)
Civil engineer, contracting Documentation      Liaison received referral for pt to return home s/p discharge with hospice services.     Writer contacted daughter, Misty Stanley, to confirm interest. Explained hospice services and philosophy and answered all questions. Daughter agrees to hospice services.   Pt's chart in review by hospice MD to determine eligibility. Will update TOC and family once pt is approved.    Please do not hesitate to outreach with any questions and thank you for the referral.     Trena Platt, RN  [facility] Liaison  [phone]

## 2020-01-29 NOTE — Progress Notes (Signed)
Patient taken off BiPAP at this time. Placed on [facility], stated she wears that at home. Doing well, no distress noted. Will continue to monitor.

## 2020-01-29 NOTE — Evaluation (Signed)
Physical Therapy Evaluation Patient Details Name: Susan Landry MRN: [id number] DOB: 1928/07/05 Today's Date: 01/29/2020   History of Present Illness  Susan Landry is a 86 y.o. female with history of dementia and possible COPD was found to be yesterday around noontime suddenly unresponsive with shortness of breath  Clinical Impression  Patient received in bed, daughter present. Patient and daughter report she needs to have BM. Nurse has been called. Patient required min assist with bed mobility, mod assist with transfers. Ambulated 15 feet to bathroom with mod assist and RW. Urinated the entire way to the bathroom all over the floor. After using bathroom required total assist for cleaning up and changing socks, donning mesh underwear and pad for walk back to bed. Bed was wet, so changed pads/gown out as well. Patient ambulated back to bed with mod assist, rw. She was fatigued after that and returned to supine with min assist. She will continue to benefit from skilled PT while here to improve strength and functional mobility to reduce caregiver burden.        Follow Up Recommendations Home health PT;Supervision for mobility/OOB;Supervision/Assistance - 24 hour    Equipment Recommendations  None recommended by PT    Recommendations for Other Services       Precautions / Restrictions Precautions Precautions: Fall Restrictions Weight Bearing Restrictions: No      Mobility  Bed Mobility Overal bed mobility: Needs Assistance Bed Mobility: Supine to Sit;Sit to Supine     Supine to sit: Min assist Sit to supine: Min assist   General bed mobility comments: assist to bring trunk upright to sitting position. Assist to bring LEs back up onto bed  Transfers Overall transfer level: Needs assistance Equipment used: Rolling walker (2 wheeled) Transfers: Sit to/from Stand Sit to Stand: Mod assist;From elevated surface         General transfer comment: mod assist to stand from bed,  mod assist to stand from commode using grab bar in bathroom  Ambulation/Gait Ambulation/Gait assistance: Mod assist Gait Distance (Feet): 30 Feet Assistive device: Rolling walker (2 wheeled) Gait Pattern/deviations: Step-to pattern;Shuffle;Decreased step length - left;Decreased step length - right;Trunk flexed Gait velocity: decreased   General Gait Details: patient pushes walker too far out in fron of her, requires assist to keep it close to her. Cues needed for safety  Stairs            Wheelchair Mobility    Modified Rankin (Stroke Patients Only)       Balance Overall balance assessment: Needs assistance Sitting-balance support: Feet supported;Bilateral upper extremity supported Sitting balance-Leahy Scale: Fair     Standing balance support: Bilateral upper extremity supported;During functional activity Standing balance-Leahy Scale: Fair Standing balance comment: reliant on RW and external assist at this time                             Pertinent Vitals/Pain Pain Assessment: No/denies pain    Home Living                   Additional Comments: lives in independent living with 24 hour PCA    Prior Function Level of Independence: Needs assistance   Gait / Transfers Assistance Needed: uses RW has caregivers to assist  ADL's / Homemaking Assistance Needed: caregivers assist        Hand Dominance        Extremity/Trunk Assessment   Upper Extremity Assessment Upper Extremity Assessment: Generalized  weakness    Lower Extremity Assessment Lower Extremity Assessment: Generalized weakness    Cervical / Trunk Assessment Cervical / Trunk Assessment: Normal  Communication   Communication: No difficulties  Cognition Arousal/Alertness: Awake/alert Behavior During Therapy: WFL for tasks assessed/performed Overall Cognitive Status: History of cognitive impairments - at baseline                                         General Comments      Exercises     Assessment/Plan    PT Assessment Patient needs continued PT services  PT Problem List Decreased strength;Decreased mobility;Decreased safety awareness;Decreased activity tolerance;Decreased balance;Decreased cognition;Cardiopulmonary status limiting activity       PT Treatment Interventions DME instruction;Therapeutic exercise;Gait training;Stair training;Functional mobility training;Therapeutic activities;Patient/family education    PT Goals (Current goals can be found in the Care Plan section)  Acute Rehab PT Goals Patient Stated Goal: to return home possibly with palliative care PT Goal Formulation: Patient unable to participate in goal setting Time For Goal Achievement: 02/12/20 Potential to Achieve Goals: Good    Frequency Min 3X/week   Barriers to discharge        Co-evaluation               AM-PAC PT "6 Clicks" Mobility  Outcome Measure Help needed turning from your back to your side while in a flat bed without using bedrails?: A Little Help needed moving from lying on your back to sitting on the side of a flat bed without using bedrails?: A Little Help needed moving to and from a bed to a chair (including a wheelchair)?: A Lot Help needed standing up from a chair using your arms (e.g., wheelchair or bedside chair)?: A Lot Help needed to walk in hospital room?: A Lot Help needed climbing 3-5 steps with a railing? : Total 6 Click Score: 13    End of Session Equipment Utilized During Treatment: Gait belt Activity Tolerance: Patient tolerated treatment well;Patient limited by fatigue;Other (comment)(SOB) Patient left: in bed;with call bell/phone within reach;with bed alarm set;with family/visitor present;with nursing/sitter in room Nurse Communication: Mobility status PT Visit Diagnosis: Unsteadiness on feet (R26.81);Other abnormalities of gait and mobility (R26.89);Difficulty in walking, not elsewhere classified  (R26.2);Muscle weakness (generalized) (M62.81)    Time: 1325-1405 PT Time Calculation (min) (ACUTE ONLY): 40 min   Charges:   PT Evaluation $PT Eval Moderate Complexity: 1 Mod PT Treatments $Gait Training: 8-22 mins $Therapeutic Activity: 8-22 mins       Slayton Lubitz, PT, GCS 01/29/20,2:20 PM

## 2020-01-30 ENCOUNTER — Inpatient Hospital Stay (HOSPITAL_COMMUNITY): Payer: Medicare Other

## 2020-01-30 LAB — GLUCOSE, CAPILLARY
Glucose-Capillary: 80 mg/dL (ref 70–99)
Glucose-Capillary: 97 mg/dL (ref 70–99)
Glucose-Capillary: 115 mg/dL — ABNORMAL HIGH (ref 70–99)
Glucose-Capillary: 141 mg/dL — ABNORMAL HIGH (ref 70–99)

## 2020-01-30 MED ORDER — AZITHROMYCIN 500 MG PO TABS
500.0000 mg | ORAL_TABLET | Freq: Every day | ORAL | Status: DC
  Administered 2020-01-30: 500 mg via ORAL
  Filled 2020-01-30: qty 1

## 2020-01-30 NOTE — Progress Notes (Signed)
Modified Barium Swallow Progress Note  Patient Details  Name: Susan Landry MRN: [id number] Date of Birth: [birth year]  Today's Date: 01/30/2020  Modified Barium Swallow completed.  Full report located under Chart Review in the Imaging Section.  Brief recommendations include the following:  Clinical Impression  Pt was seen for a modified barium swallow study and she presents with minimal-mild oropharyngeal dysphagia c/b laryngeal penetration that did not fully clear the laryngeal vestibule with thin liquid via straw sip.  Pt consumed trials of thin liquid via tsp, cup, straw, nectar-thick liquid via straw, puree, and regular solids.  Trials of thin liquid via straw sip resulted in laryngeal penetration before the swallow secondary to delayed swallow initiation and delayed laryngeal closure.  1/2 trials resulted in transient penetration and the other trial resulted in stagnant penetration with a delayed throat clear.  Of note, transient laryngeal penetration is functional for the pt's age group.  No aspiration was observed with any trials and no laryngeal penetration was observed with thin liquid via tsp/cup sip, nectar-thick liquid, puree, or regular solids.  Mastication of regular solids was timely and only trace oral residue was observed intermittently across trials secondary to reduced lingual strength. Pharyngeal phase was remarkable for mildly reduced BOT retraction and hyolaryngeal excursion resulting in trace vallecular residue.   Pharyngoesophageal phase was unremarkable.  Recommend regular solids and thin liquids (No Straws) with medications administered whole with puree.  Pt has some difficulty holding the cup; therefore, may use the straw with small sips of liquid if necessary.  Pt was noted to be mildly impulsive with liquid intake c/b taking large serial sips.  Pt would benefit from intermittent supervision during meals to cue for compensatory strategies.  SLP will briefly f/u to monitor  diet tolerance.     Swallow Evaluation Recommendations       SLP Diet Recommendations: Regular solids;Thin liquid   Liquid Administration via: No straw;Cup   Medication Administration: Whole meds with puree   Supervision: Patient able to self feed;Intermittent supervision to cue for compensatory strategies   Compensations: Minimize environmental distractions;Slow rate;Small sips/bites   Postural Changes: Seated upright at 90 degrees   Oral Care Recommendations: Oral care BID       Villa Herb M.S., CCC-SLP Acute Rehabilitation Services Office: [phone]  Shanon Rosser Kerstyn Coryell 01/30/2020,1:41 PM

## 2020-01-30 NOTE — Progress Notes (Signed)
[facility] Documentation  Pt has been approved for hospice care at home. TOC and pt's daughter, Misty Stanley, notified. Misty Stanley was on a conference call with her siblings re: SNF vs home discussion and will follow up with liaison later in the day.   Please do not hesitate to outreach with any questions.   Thank you, Trena Platt, RN [facility] Liaison [phone]

## 2020-01-30 NOTE — Social Work (Signed)
CSW received call from patient's daughter Misty Stanley. Daughter informed CSW that patient will be returning to [facility] and outpatient hospice will follow. Daughter stated she would like patient to try to be discharged by 12p, CSW relaying information to MD. Sharin Mons needed for transport.  [employer], [address]

## 2020-01-30 NOTE — Progress Notes (Signed)
PROGRESS NOTE    Susan Landry  ZOX:[id number] DOB: Nov 01, 1927 DOA: 01/27/2020 PCP: Merlene LaughterStoneking, Hal, MD    Brief Narrative: 86 y.o.femalewithhistory of dementia and possible COPD was found to be yesterday around noontime suddenly unresponsive with shortness of breath. Per the report patient staff left the room briefly and came back to see the patient becoming unresponsive EMS was called and patient had to be placed on 100% nonrebreather. Patient became more alert on the way. Per patient's daughter patient was talking to the daughter early in the morning. Had no chest pain no nausea vomiting or diarrhea prior to that. Was admitted in October 2020 for possible COPD exacerbation.  ED Course:Initially it was thought that patient may have sudden flash pulmonary edema was given Lasix and nitroglycerin was started but following which patient became more hypotensive and CT chest was done which shows features concerning for possible pneumonia. Patient lactate elevated at around 4.1. BNP was 356. Blood glucose 310 creatinine 1.1 Covid test negative WBC 10.7 high-sensitivity troponin was 27 and then 50. Patient was started on empiric antibiotics for possible sepsis and pneumonia. Admitted for further management.  Assessment & Plan:   Principal Problem:   Sepsis (HCC) Active Problems:   Essential hypertension   Acute respiratory failure with hypoxia (HCC)   ARF (acute renal failure) (HCC)   Hyperglycemia  #1 acute hypoxic respiratory failure secondary to pneumonia-patient has been on and off BiPAP. Discussed in detail with patient and daughter who does not want any more BiPAP even if patient gets hypoxic.  I would only continue oxygen and Lasix.  And antibiotics.  Swallow study with mild aspiration.  #2 history of dementia on Aricept.  #3 history of COPD stable restart Spiriva and Breo Ellipta and albuterol   Nutrition Problem: Inadequate oral intake Etiology:  lethargy/confusion     Signs/Symptoms: other (comment)(RD observation)    Interventions: Ensure Enlive (each supplement provides 350kcal and 20 grams of protein), MVI, Magic cup  Estimated body mass index is 34.66 kg/m as calculated from the following:   Height as of this encounter: 5' (1.524 m).   Weight as of this encounter: 80.5 kg.  DVT prophylaxis: Lovenox  code Status: DO NOT RESUSCITATE  family Communication: Discussed with patient's daughter Misty StanleyLisa  disposition Plan:  Status is: Inpatient  Dispo: The patient is from nursing home  Anticipated d/c is to: Nursing home  Anticipated d/c date is: 48 hours  Patient currently is not medically stable to d/c.  Admitted with acute hypoxic respiratory failure secondary to pneumonia   Consultants: None  Procedures: None Antimicrobials:  Rocephin and Flagyl  Subjective: Patient resting in bed with BiPAP on wants it taken off she does not want any more BiPAP  Objective: Vitals:   01/29/20 2341 01/30/20 0328 01/30/20 0817 01/30/20 0832  BP: 105/65 128/85 125/78   Pulse: 95 84 82   Resp: 20 20 18    Temp: 97.9 F (36.6 C) 98 F (36.7 C) 98.1 F (36.7 C)   TempSrc: Oral Oral Oral   SpO2: 100% 99% 96% 97%  Weight:  80.5 kg    Height:        Intake/Output Summary (Last 24 hours) at 01/30/2020 1534 Last data filed at 01/30/2020 0100 Gross per 24 hour  Intake 350 ml  Output 2250 ml  Net -1900 ml   Filed Weights   01/28/20 0100 01/29/20 0500 01/30/20 0328  Weight: 80.3 kg 86.8 kg 80.5 kg    Examination:  General exam: Appears  calm and comfortable  Respiratory system: Scattered rhonchi to auscultation. Respiratory effort normal. Cardiovascular system: S1 & S2 heard, RRR. No JVD, murmurs, rubs, gallops or clicks. No pedal edema. Gastrointestinal system: Abdomen is nondistended, soft and nontender. No organomegaly or masses felt. Normal bowel sounds heard. Central nervous system:  Alert and oriented. No focal neurological deficits. Extremities: 1+ pitting edema  skin: No rashes, lesions or ulcers Psychiatry: Judgement and insight appear normal. Mood & affect appropriate.     Data Reviewed: I have personally reviewed following labs and imaging studies  CBC: Recent Labs  Lab 01/27/20 1854 01/27/20 1903 01/28/20 0436  WBC 10.7*  --  11.7*  NEUTROABS 7.9*  --  9.1*  HGB 13.6 15.3* 11.0*  HCT 43.9 45.0 36.1  MCV 94.0  --  95.3  PLT 271  --  207   Basic Metabolic Panel: Recent Labs  Lab 01/27/20 1854 01/27/20 1903 01/28/20 0436  NA 136 131* 142  K 4.6 4.2 4.4  CL 88*  --  98  CO2 30  --  34*  GLUCOSE 310*  --  91  BUN 22  --  19  CREATININE 1.15*  --  0.88  CALCIUM 9.9  --  8.1*   GFR: Estimated Creatinine Clearance: 39.9 mL/min (by C-G formula based on SCr of 0.88 mg/dL). Liver Function Tests: Recent Labs  Lab 01/27/20 1854 01/28/20 0436  AST 33 76*  ALT 23 75*  ALKPHOS 117 104  BILITOT 0.9 1.0  PROT 7.5 5.6*  ALBUMIN 3.9 2.8*   No results for input(s): LIPASE, AMYLASE in the last 168 hours. No results for input(s): AMMONIA in the last 168 hours. Coagulation Profile: Recent Labs  Lab 01/28/20 0436  INR 1.0   Cardiac Enzymes: No results for input(s): CKTOTAL, CKMB, CKMBINDEX, TROPONINI in the last 168 hours. BNP (last 3 results) No results for input(s): PROBNP in the last 8760 hours. HbA1C: Recent Labs    01/28/20 0436  HGBA1C 6.1*   CBG: Recent Labs  Lab 01/29/20 1657 01/29/20 2056 01/29/20 2346 01/30/20 0326 01/30/20 0812  GLUCAP 97 142* 73 80 97   Lipid Profile: No results for input(s): CHOL, HDL, LDLCALC, TRIG, CHOLHDL, LDLDIRECT in the last 72 hours. Thyroid Function Tests: No results for input(s): TSH, T4TOTAL, FREET4, T3FREE, THYROIDAB in the last 72 hours. Anemia Panel: No results for input(s): VITAMINB12, FOLATE, FERRITIN, TIBC, IRON, RETICCTPCT in the last 72 hours. Sepsis Labs: Recent Labs  Lab  01/27/20 1854 01/27/20 1940 01/28/20 0436 01/28/20 0644  PROCALCITON  --   --  0.20  --   LATICACIDVEN 4.4* 4.1* 1.3 1.5    Recent Results (from the past 240 hour(s))  SARS Coronavirus 2 by RT PCR (hospital order, performed in [facility] hospital lab) Nasopharyngeal Nasopharyngeal Swab     Status: None   Collection Time: 01/27/20  7:45 PM   Specimen: Nasopharyngeal Swab  Result Value Ref Range Status   SARS Coronavirus 2 NEGATIVE NEGATIVE Final    Comment: (NOTE) SARS-CoV-2 target nucleic acids are NOT DETECTED. The SARS-CoV-2 RNA is generally detectable in upper and lower respiratory specimens during the acute phase of infection. The lowest concentration of SARS-CoV-2 viral copies this assay can detect is 250 copies / mL. A negative result does not preclude SARS-CoV-2 infection and should not be used as the sole basis for treatment or other patient management decisions.  A negative result may occur with improper specimen collection / handling, submission of specimen other than nasopharyngeal swab, presence  of viral mutation(s) within the areas targeted by this assay, and inadequate number of viral copies (<250 copies / mL). A negative result must be combined with clinical observations, patient history, and epidemiological information. Fact Sheet for Patients:   BoilerBrush.com.cy Fact Sheet for Healthcare Providers: https://pope.com/ This test is not yet approved or cleared  by the [place] FDA and has been authorized for detection and/or diagnosis of SARS-CoV-2 by FDA under an Emergency Use Authorization (EUA).  This EUA will remain in effect (meaning this test can be used) for the duration of the COVID-19 declaration under Section 564(b)(1) of the Act, 21 U.S.C. section 360bbb-3(b)(1), unless the authorization is terminated or revoked sooner. Performed at [facility], [address], [place], [place] [address]    Culture, blood (routine x 2)     Status: None (Preliminary result)   Collection Time: 01/27/20  9:25 PM   Specimen: BLOOD  Result Value Ref Range Status   Specimen Description BLOOD RIGHT HAND  Final   Special Requests   Final    BOTTLES DRAWN AEROBIC AND ANAEROBIC Blood Culture adequate volume   Culture   Final    NO GROWTH 3 DAYS Performed at [facility], [address], [place], [place] [address]    Report Status PENDING  Incomplete  Culture, blood (routine x 2)     Status: None (Preliminary result)   Collection Time: 01/27/20  9:30 PM   Specimen: BLOOD  Result Value Ref Range Status   Specimen Description BLOOD RIGHT ARM  Final   Special Requests   Final    BOTTLES DRAWN AEROBIC AND ANAEROBIC Blood Culture results may not be optimal due to an inadequate volume of blood received in culture bottles   Culture   Final    NO GROWTH 3 DAYS Performed at [facility], [address], [place], [place] [address]    Report Status PENDING  Incomplete  MRSA PCR Screening     Status: None   Collection Time: 01/28/20  7:41 PM   Specimen: Nasal Mucosa; Nasopharyngeal  Result Value Ref Range Status   MRSA by PCR NEGATIVE NEGATIVE Final    Comment:        The GeneXpert MRSA Assay (FDA approved for NASAL specimens only), is one component of a comprehensive MRSA colonization surveillance program. It is not intended to diagnose MRSA infection nor to guide or monitor treatment for MRSA infections. Performed at [facility], [address] [place], [place] [address]          Radiology Studies: DG Swallowing Func-Speech Pathology  Result Date: 01/30/2020 Objective Swallowing Evaluation: Type of Study: MBS-Modified Barium Swallow Study  Patient Details Name: LOVINA ZUVER MRN: [id number] Date of Birth: 10-02-1927 Today's Date: 01/30/2020 Time: SLP Start Time (ACUTE ONLY): 1201 -SLP Stop Time (ACUTE ONLY): 1221 SLP Time Calculation (min) (ACUTE ONLY): 20 min Past  Medical History: Past Medical History: Diagnosis Date . Cerebral aneurysm  . HYPERTENSION 07/14/2007 . MILD COGNITIVE IMPAIRMENT SO STATED 07/13/2009 . OSTEOARTHRITIS 01/12/2008 . OVERACTIVE BLADDER 07/14/2008 Past Surgical History: Past Surgical History: Procedure Laterality Date . BREAST SURGERY    abcess . CSF SHUNT    cerebral aneurysm . TONSILLECTOMY   . TOTAL KNEE ARTHROPLASTY   HPI: SHREENA BAINES is a 86 y.o. female with history of dementia and possible COPD was found to be suddenly unresponsive with shortness of breath.  Per the report patient staff left the room briefly and  came back to see the patient becoming unresponsive.  EMS was called and patient had to be placed on 100% nonrebreather.  Patient became more alert on the way. Chest CT reported concerns for atelectasis vs PNA, possibly aspiration PNA.  Subjective: Pt was pleasant and alert Assessment / Plan / Recommendation CHL IP CLINICAL IMPRESSIONS 01/30/2020 Clinical Impression Pt was seen for a modified barium swallow study and she presents with minimal-mild oropharyngeal dysphagia c/b laryngeal penetration that did not fully clear the laryngeal vestibule with thin liquid via straw sip.  Pt consumed trials of thin liquid via tsp, cup, & straw, nectar-thick liquid via straw, puree, and regular solids.  Trials of thin liquid via straw sip resulted in laryngeal penetration before the swallow secondary to delayed swallow initiation and delayed laryngeal closure.  1/2 trials resulted in transient penetration and the other trial resulted in stagnant penetration with a delayed throat clear.  Of note, transient laryngeal penetration is functional for the pt's age group.  No aspiration was observed with any trials and no laryngeal penetration was observed with thin liquid via tsp/cup sip, nectar-thick liquid, puree, or regular solids.  Mastication of regular solids was timely and only trace oral residue was observed intermittently across trials secondary to  reduced lingual strength. Pharyngeal phase was remarkable for mildly reduced BOT retraction and hyolaryngeal excursion resulting in trace vallecular residue.   Pharyngoesophageal phase was unremarkable.  Recommend regular solids and thin liquids (No Straws) with medications administered whole with puree.  Pt has some difficulty holding the cup; therefore, she may use the straw to take small sips of liquid if necessary.  Pt was noted to be mildly impulsive with liquid intake c/b taking large serial sips.  Pt would benefit from intermittent supervision during meals to cue for compensatory strategies.   SLP Visit Diagnosis Dysphagia, oropharyngeal phase (R13.12) Attention and concentration deficit following -- Frontal lobe and executive function deficit following -- Impact on safety and function Mild aspiration risk   CHL IP TREATMENT RECOMMENDATION 01/30/2020 Treatment Recommendations Therapy as outlined in treatment plan below   Prognosis 01/30/2020 Prognosis for Safe Diet Advancement Good Barriers to Reach Goals -- Barriers/Prognosis Comment -- CHL IP DIET RECOMMENDATION 01/30/2020 SLP Diet Recommendations Regular solids;Thin liquid Liquid Administration via No straw;Cup Medication Administration Whole meds with puree Compensations Minimize environmental distractions;Slow rate;Small sips/bites Postural Changes Seated upright at 90 degrees   CHL IP OTHER RECOMMENDATIONS 01/30/2020 Recommended Consults -- Oral Care Recommendations Oral care BID Other Recommendations --   CHL IP FOLLOW UP RECOMMENDATIONS 01/30/2020 Follow up Recommendations None   CHL IP FREQUENCY AND DURATION 01/30/2020 Speech Therapy Frequency (ACUTE ONLY) min 2x/week Treatment Duration 2 weeks      CHL IP ORAL PHASE 01/30/2020 Oral Phase Impaired Oral - Pudding Teaspoon -- Oral - Pudding Cup -- Oral - Honey Teaspoon -- Oral - Honey Cup -- Oral - Nectar Teaspoon -- Oral - Nectar Cup -- Oral - Nectar Straw Lingual/palatal residue Oral - Thin Teaspoon WFL  Oral - Thin Cup Lingual/palatal residue;Piecemeal swallowing Oral - Thin Straw Lingual/palatal residue;Piecemeal swallowing Oral - Puree Lingual/palatal residue Oral - Mech Soft -- Oral - Regular Lingual/palatal residue Oral - Multi-Consistency -- Oral - Pill -- Oral Phase - Comment --  CHL IP PHARYNGEAL PHASE 01/30/2020 Pharyngeal Phase Impaired Pharyngeal- Pudding Teaspoon -- Pharyngeal -- Pharyngeal- Pudding Cup -- Pharyngeal -- Pharyngeal- Honey Teaspoon -- Pharyngeal -- Pharyngeal- Honey Cup -- Pharyngeal -- Pharyngeal- Nectar Teaspoon -- Pharyngeal -- Pharyngeal- Nectar Cup -- Pharyngeal --  Pharyngeal- Nectar Straw Delayed swallow initiation-pyriform sinuses;Reduced anterior laryngeal mobility;Reduced tongue base retraction Pharyngeal Material does not enter airway Pharyngeal- Thin Teaspoon Delayed swallow initiation-vallecula;Reduced anterior laryngeal mobility;Reduced tongue base retraction Pharyngeal Material does not enter airway Pharyngeal- Thin Cup Delayed swallow initiation-pyriform sinuses;Pharyngeal residue - valleculae;Reduced anterior laryngeal mobility;Reduced tongue base retraction Pharyngeal Material does not enter airway Pharyngeal- Thin Straw Delayed swallow initiation-pyriform sinuses;Reduced tongue base retraction;Reduced anterior laryngeal mobility;Penetration/Aspiration before swallow;Pharyngeal residue - valleculae Pharyngeal Material enters airway, remains ABOVE vocal cords and not ejected out;Material enters airway, remains ABOVE vocal cords then ejected out Pharyngeal- Puree Delayed swallow initiation-vallecula;Pharyngeal residue - valleculae;Reduced anterior laryngeal mobility;Reduced tongue base retraction Pharyngeal Material does not enter airway Pharyngeal- Mechanical Soft -- Pharyngeal -- Pharyngeal- Regular Delayed swallow initiation-vallecula;Reduced anterior laryngeal mobility;Reduced tongue base retraction Pharyngeal Material does not enter airway Pharyngeal- Multi-consistency  -- Pharyngeal -- Pharyngeal- Pill -- Pharyngeal -- Pharyngeal Comment --  CHL IP CERVICAL ESOPHAGEAL PHASE 01/30/2020 Cervical Esophageal Phase WFL Pudding Teaspoon -- Pudding Cup -- Honey Teaspoon -- Honey Cup -- Nectar Teaspoon -- Nectar Cup -- Nectar Straw -- Thin Teaspoon -- Thin Cup -- Thin Straw -- Puree -- Mechanical Soft -- Regular -- Multi-consistency -- Pill -- Cervical Esophageal Comment -- Colin Mulders M.S., CCC-SLP Acute Rehabilitation Services Office: [phone] Elvia Collum [facility] 01/30/2020, 1:48 PM                   Scheduled Meds: . azithromycin  500 mg Oral QHS  . feeding supplement (ENSURE ENLIVE)  237 mL Oral BID BM  . fluticasone furoate-vilanterol  1 puff Inhalation Daily  . insulin aspart  0-9 Units Subcutaneous Q4H  . multivitamin with minerals  1 tablet Oral Daily  . pantoprazole  40 mg Oral Daily  . polyethylene glycol  17 g Oral Daily  . umeclidinium bromide  1 puff Inhalation Daily   Continuous Infusions: . cefTRIAXone (ROCEPHIN)  IV 2 g (01/29/20 2110)     LOS: 2 days    Georgette Shell, MD 01/30/2020, 3:34 PM

## 2020-01-31 LAB — GLUCOSE, CAPILLARY
Glucose-Capillary: 126 mg/dL — ABNORMAL HIGH (ref 70–99)
Glucose-Capillary: 88 mg/dL (ref 70–99)
Glucose-Capillary: 99 mg/dL (ref 70–99)

## 2020-01-31 MED ORDER — PANTOPRAZOLE SODIUM 40 MG PO TBEC: 40.0000 mg | DELAYED_RELEASE_TABLET | Freq: Every day | ORAL | 1 refills | Status: AC

## 2020-01-31 NOTE — Progress Notes (Signed)
SLP Cancellation Note  Patient Details Name: Susan Landry MRN: [id number] DOB: [birth year]   Cancelled treatment:  Pt is D/Cing home.  MBS yesterday showed protective swallow.  SLP to sign off.  Emoni Yang L. Samson Frederic, MA CCC/SLP Acute Rehabilitation Services Office number [phone] Pager [phone]          [name] 01/31/2020, 10:48 AM

## 2020-01-31 NOTE — Progress Notes (Signed)
Patient being discharged home, transportation provided by [facility]. Belongings returned to patient °

## 2020-01-31 NOTE — Discharge Summary (Signed)
Physician Discharge Summary  Susan Landry TDD:[id number] DOB: [birth year] DOA: 01/27/2020  PCP: Merlene Laughter, MD  Admit date: 01/27/2020 Discharge date: 01/31/2020  Admitted From: Home Disposition: Hospice home Recommendations for Outpatient Follow-up:  1. Follow up with PCP in 1-2 weeks 2. Please obtain BMP/CBC in one week  Home Health: None  equipment/Devices: None  Discharge Condition: Hospice discharge CODE STATUS DO NOT RESUSCITATE Diet recommendation: Regular diet Brief/Interim Summary:86 y.o.femalewithhistory of dementia and possible COPD was found to be yesterday around noontime suddenly unresponsive with shortness of breath. Per the report patient staff left the room briefly and came back to see the patient becoming unresponsive EMS was called and patient had to be placed on 100% nonrebreather. Patient became more alert on the way. Per patient's daughter patient was talking to the daughter early in the morning. Had no chest pain no nausea vomiting or diarrhea prior to that. Was admitted in October 2020 for possible COPD exacerbation.  ED Course:Initially it was thought that patient may have sudden flash pulmonary edema was given Lasix and nitroglycerin was started but following which patient became more hypotensive and CT chest was done which shows features concerning for possible pneumonia. Patient lactate elevated at around 4.1. BNP was 356. Blood glucose 310 creatinine 1.1 Covid test negative WBC 10.7 high-sensitivity troponin was 27 and then 50. Patient was started on empiric antibiotics for possible sepsis and pneumonia. Admitted for further management.   Discharge Diagnoses:  Principal Problem:   Sepsis (HCC) Active Problems:   Essential hypertension   Acute respiratory failure with hypoxia (HCC)   ARF (acute renal failure) (HCC)   Hyperglycemia   #1 acute hypoxic respiratory failure secondary to pneumonia-patient has been on and off BiPAP. Discussed  in detail with patient and daughter who does not want any more BiPAP even if patient gets hypoxic.  Patient and family would like to keep her comfortable on oxygen and as needed Lasix for her breathing.  Patient will be discharged home today with hospice.  Swallow study done showed mild aspiration.  However patient may eat whatever she would like to eat.  #2 history of dementia on Aricept.  #3 history of COPD stable continue Spiriva and Breo Ellipta and albuterol     Nutrition Problem: Inadequate oral intake Etiology: lethargy/confusion    Signs/Symptoms: other (comment)(RD observation)     Interventions: Ensure Enlive (each supplement provides 350kcal and 20 grams of protein), MVI, Magic cup  Estimated body mass index is 34.66 kg/m as calculated from the following:   Height as of this encounter: 5' (1.524 m).   Weight as of this encounter: 80.5 kg.  Discharge Instructions  Discharge Instructions    Diet - low sodium heart healthy   Complete by: As directed    Increase activity slowly   Complete by: As directed      Allergies as of 01/31/2020   No Known Allergies     Medication List    STOP taking these medications   Calcium 600-200 MG-UNIT tablet   multivitamin tablet     TAKE these medications   albuterol 108 (90 Base) MCG/ACT inhaler Commonly known as: VENTOLIN HFA Inhale 2 puffs into the lungs every 6 (six) hours as needed for wheezing or shortness of breath.   Breo Ellipta 100-25 MCG/INH Aepb Generic drug: fluticasone furoate-vilanterol Inhale 1 puff into the lungs daily.   donepezil 10 MG tablet Commonly known as: ARICEPT Take 1 tablet (10 mg total) by mouth at bedtime.   furosemide  40 MG tablet Commonly known as: LASIX Take 1 tablet (40 mg total) by mouth 2 (two) times daily.   pantoprazole 40 MG tablet Commonly known as: PROTONIX Take 1 tablet (40 mg total) by mouth daily.   potassium chloride 10 MEQ tablet Commonly known as:  KLOR-CON Take 1 tablet (10 mEq total) by mouth daily.   PRESERVISION AREDS 2 PO Take 1 tablet by mouth 2 (two) times daily.   Spiriva HandiHaler 18 MCG inhalation capsule Generic drug: tiotropium Place 1 capsule (18 mcg total) into inhaler and inhale daily.       No Known Allergies  Consultations: None  Procedures/Studies: CT Chest W Contrast  Result Date: 01/27/2020 CLINICAL DATA:  Respiratory illness, nondiagnostic xray. EXAM: CT CHEST WITH CONTRAST TECHNIQUE: Multidetector CT imaging of the chest was performed during intravenous contrast administration. CONTRAST:  75mL OMNIPAQUE IOHEXOL 300 MG/ML  SOLN COMPARISON:  Chest radiograph earlier today. Chest CTA 06/29/2019 FINDINGS: Cardiovascular: Aortic atherosclerosis. No aortic dissection. No central pulmonary embolus to the lobar level. Hiatal hernia displaces the heart anteriorly. Normal heart size. Coronary artery calcifications. Small amount pericardial fluid in the pericardial recesses without significant effusion. Mediastinum/Nodes: Large hiatal hernia, approximately 50% of the stomach is intrathoracic. Hiatal hernia has increased from prior exam and is fluid-filled. Remainder the esophagus is patulous. No visualized thyroid nodule. Small mediastinal lymph nodes are not enlarged by size criteria. Lungs/Pleura: Elevated right hemidiaphragm with adjacent compressive atelectasis of the right middle and right lower lobes. No dependent airspace disease in the left greater than right lower lobe with air bronchograms. Mild consolidation in the perifissural and dependent right upper lobe. Linear atelectasis in the lingula. No pulmonary edema. No significant pleural fluid. No intraluminal debris within the trachea or mainstem bronchi. Compressive changes in the right middle and lower lobe bronchus related to elevated hemidiaphragm. Upper Abdomen: Peripherally calcified liver lesion in the dome, unchanged. Mild left adrenal thickening not included  in the prior field of view. Musculoskeletal: Degenerative change throughout the spine. Mild scoliosis. There are no acute or suspicious osseous abnormalities. There is shunt catheter tubing in the right anterior chest wall. IMPRESSION: 1. Elevated right hemidiaphragm with adjacent compressive atelectasis of the right middle and right lower lobes. Dependent airspace disease in the dependent lungs, left greater than right lower lobe and dependent right upper lobe. This may represent atelectasis or pneumonia, including aspiration, however there is no debris in the trachea or mainstem bronchi. 2. Large hiatal hernia, approximately 50% of the stomach is intrathoracic. This has increased from prior exam and is fluid-filled. Aortic Atherosclerosis (ICD10-I70.0). Electronically Signed   By: Narda Rutherford M.D.   On: 01/27/2020 21:40   DG Chest Port 1 View  Result Date: 01/27/2020 CLINICAL DATA:  Respiratory distress EXAM: PORTABLE CHEST 1 VIEW COMPARISON:  06/29/2019 FINDINGS: Hypoventilatory change with chronic elevation of right diaphragm. Moderate hiatal hernia slightly increased. Increased left basilar opacity. Subsegmental atelectasis right base. Stable cardiomediastinal silhouette with aortic atherosclerosis. No pneumothorax. Right-sided shunt tubing. IMPRESSION: 1. Low lung volumes with subsegmental atelectasis at the right base. Airspace disease at the left base may reflect atelectasis or possible pneumonia 2. Increased hiatal hernia.  Chronic elevation of right diaphragm Electronically Signed   By: Jasmine Pang M.D.   On: 01/27/2020 19:38   DG Swallowing Func-Speech Pathology  Result Date: 01/30/2020 Objective Swallowing Evaluation: Type of Study: MBS-Modified Barium Swallow Study  Patient Details Name: Susan Landry MRN: [id number] Date of Birth: Feb 02, 1928 Today's Date: 01/30/2020 Time: SLP Start  Time (ACUTE ONLY): 1201 -SLP Stop Time (ACUTE ONLY): 1221 SLP Time Calculation (min) (ACUTE ONLY): 20 min  Past Medical History: Past Medical History: Diagnosis Date . Cerebral aneurysm  . HYPERTENSION 07/14/2007 . MILD COGNITIVE IMPAIRMENT SO STATED 07/13/2009 . OSTEOARTHRITIS 01/12/2008 . OVERACTIVE BLADDER 07/14/2008 Past Surgical History: Past Surgical History: Procedure Laterality Date . BREAST SURGERY    abcess . CSF SHUNT    cerebral aneurysm . TONSILLECTOMY   . TOTAL KNEE ARTHROPLASTY   HPI: Susan Landry is a 86 y.o. female with history of dementia and possible COPD was found to be suddenly unresponsive with shortness of breath.  Per the report patient staff left the room briefly and came back to see the patient becoming unresponsive.  EMS was called and patient had to be placed on 100% nonrebreather.  Patient became more alert on the way. Chest CT reported concerns for atelectasis vs PNA, possibly aspiration PNA.  Subjective: Pt was pleasant and alert Assessment / Plan / Recommendation CHL IP CLINICAL IMPRESSIONS 01/30/2020 Clinical Impression Pt was seen for a modified barium swallow study and she presents with minimal-mild oropharyngeal dysphagia c/b laryngeal penetration that did not fully clear the laryngeal vestibule with thin liquid via straw sip.  Pt consumed trials of thin liquid via tsp, cup, & straw, nectar-thick liquid via straw, puree, and regular solids.  Trials of thin liquid via straw sip resulted in laryngeal penetration before the swallow secondary to delayed swallow initiation and delayed laryngeal closure.  1/2 trials resulted in transient penetration and the other trial resulted in stagnant penetration with a delayed throat clear.  Of note, transient laryngeal penetration is functional for the pt's age group.  No aspiration was observed with any trials and no laryngeal penetration was observed with thin liquid via tsp/cup sip, nectar-thick liquid, puree, or regular solids.  Mastication of regular solids was timely and only trace oral residue was observed intermittently across trials secondary to  reduced lingual strength. Pharyngeal phase was remarkable for mildly reduced BOT retraction and hyolaryngeal excursion resulting in trace vallecular residue.   Pharyngoesophageal phase was unremarkable.  Recommend regular solids and thin liquids (No Straws) with medications administered whole with puree.  Pt has some difficulty holding the cup; therefore, she may use the straw to take small sips of liquid if necessary.  Pt was noted to be mildly impulsive with liquid intake c/b taking large serial sips.  Pt would benefit from intermittent supervision during meals to cue for compensatory strategies.   SLP Visit Diagnosis Dysphagia, oropharyngeal phase (R13.12) Attention and concentration deficit following -- Frontal lobe and executive function deficit following -- Impact on safety and function Mild aspiration risk   CHL IP TREATMENT RECOMMENDATION 01/30/2020 Treatment Recommendations Therapy as outlined in treatment plan below   Prognosis 01/30/2020 Prognosis for Safe Diet Advancement Good Barriers to Reach Goals -- Barriers/Prognosis Comment -- CHL IP DIET RECOMMENDATION 01/30/2020 SLP Diet Recommendations Regular solids;Thin liquid Liquid Administration via No straw;Cup Medication Administration Whole meds with puree Compensations Minimize environmental distractions;Slow rate;Small sips/bites Postural Changes Seated upright at 90 degrees   CHL IP OTHER RECOMMENDATIONS 01/30/2020 Recommended Consults -- Oral Care Recommendations Oral care BID Other Recommendations --   CHL IP FOLLOW UP RECOMMENDATIONS 01/30/2020 Follow up Recommendations None   CHL IP FREQUENCY AND DURATION 01/30/2020 Speech Therapy Frequency (ACUTE ONLY) min 2x/week Treatment Duration 2 weeks      CHL IP ORAL PHASE 01/30/2020 Oral Phase Impaired Oral - Pudding Teaspoon -- Oral - Pudding Cup --  Oral - Honey Teaspoon -- Oral - Honey Cup -- Oral - Nectar Teaspoon -- Oral - Nectar Cup -- Oral - Nectar Straw Lingual/palatal residue Oral - Thin Teaspoon WFL  Oral - Thin Cup Lingual/palatal residue;Piecemeal swallowing Oral - Thin Straw Lingual/palatal residue;Piecemeal swallowing Oral - Puree Lingual/palatal residue Oral - Mech Soft -- Oral - Regular Lingual/palatal residue Oral - Multi-Consistency -- Oral - Pill -- Oral Phase - Comment --  CHL IP PHARYNGEAL PHASE 01/30/2020 Pharyngeal Phase Impaired Pharyngeal- Pudding Teaspoon -- Pharyngeal -- Pharyngeal- Pudding Cup -- Pharyngeal -- Pharyngeal- Honey Teaspoon -- Pharyngeal -- Pharyngeal- Honey Cup -- Pharyngeal -- Pharyngeal- Nectar Teaspoon -- Pharyngeal -- Pharyngeal- Nectar Cup -- Pharyngeal -- Pharyngeal- Nectar Straw Delayed swallow initiation-pyriform sinuses;Reduced anterior laryngeal mobility;Reduced tongue base retraction Pharyngeal Material does not enter airway Pharyngeal- Thin Teaspoon Delayed swallow initiation-vallecula;Reduced anterior laryngeal mobility;Reduced tongue base retraction Pharyngeal Material does not enter airway Pharyngeal- Thin Cup Delayed swallow initiation-pyriform sinuses;Pharyngeal residue - valleculae;Reduced anterior laryngeal mobility;Reduced tongue base retraction Pharyngeal Material does not enter airway Pharyngeal- Thin Straw Delayed swallow initiation-pyriform sinuses;Reduced tongue base retraction;Reduced anterior laryngeal mobility;Penetration/Aspiration before swallow;Pharyngeal residue - valleculae Pharyngeal Material enters airway, remains ABOVE vocal cords and not ejected out;Material enters airway, remains ABOVE vocal cords then ejected out Pharyngeal- Puree Delayed swallow initiation-vallecula;Pharyngeal residue - valleculae;Reduced anterior laryngeal mobility;Reduced tongue base retraction Pharyngeal Material does not enter airway Pharyngeal- Mechanical Soft -- Pharyngeal -- Pharyngeal- Regular Delayed swallow initiation-vallecula;Reduced anterior laryngeal mobility;Reduced tongue base retraction Pharyngeal Material does not enter airway Pharyngeal- Multi-consistency  -- Pharyngeal -- Pharyngeal- Pill -- Pharyngeal -- Pharyngeal Comment --  CHL IP CERVICAL ESOPHAGEAL PHASE 01/30/2020 Cervical Esophageal Phase WFL Pudding Teaspoon -- Pudding Cup -- Honey Teaspoon -- Honey Cup -- Nectar Teaspoon -- Nectar Cup -- Nectar Straw -- Thin Teaspoon -- Thin Cup -- Thin Straw -- Puree -- Mechanical Soft -- Regular -- Multi-consistency -- Pill -- Cervical Esophageal Comment -- Colin Mulders., M.S., CCC-SLP Acute Rehabilitation Services Office: [phone] Elvia Collum [facility] 01/30/2020, 1:48 PM               (Echo, Carotid, EGD, Colonoscopy, ERCP)    Subjective: Patient trying to get out of bed to sit in the bedside commode wheezing anxious to go back home  Discharge Exam: Vitals:   01/31/20 0436 01/31/20 0857  BP: (!) 144/95 (!) 155/88  Pulse: 86 (!) 101  Resp: 19 18  Temp: 98 F (36.7 C) 98.7 F (37.1 C)  SpO2: 95% 92%   Vitals:   01/30/20 2014 01/30/20 2358 01/31/20 0436 01/31/20 0857  BP: (!) 141/84 129/81 (!) 144/95 (!) 155/88  Pulse: (!) 103 87 86 (!) 101  Resp: 19 19 19 18   Temp: 97.9 F (36.6 C) (!) 97.5 F (36.4 C) 98 F (36.7 C) 98.7 F (37.1 C)  TempSrc: Oral Oral Oral Oral  SpO2: 92% 95% 95% 92%  Weight:      Height:        General: Pt is alert, awake, not in acute distress Cardiovascular: RRR, S1/S2 +, no rubs, no gallops Respiratory: Wheezing bilaterally, no wheezing, no rhonchi Abdominal: Soft, NT, ND, bowel sounds + Extremities: no edema, no cyanosis    The results of significant diagnostics from this hospitalization (including imaging, microbiology, ancillary and laboratory) are listed below for reference.     Microbiology: Recent Results (from the past 240 hour(s))  SARS Coronavirus 2 by RT PCR (hospital order, performed in [facility] hospital lab) Nasopharyngeal Nasopharyngeal Swab  Status: None   Collection Time: 01/27/20  7:45 PM   Specimen: Nasopharyngeal Swab  Result Value Ref Range Status   SARS Coronavirus 2 NEGATIVE  NEGATIVE Final    Comment: (NOTE) SARS-CoV-2 target nucleic acids are NOT DETECTED. The SARS-CoV-2 RNA is generally detectable in upper and lower respiratory specimens during the acute phase of infection. The lowest concentration of SARS-CoV-2 viral copies this assay can detect is 250 copies / mL. A negative result does not preclude SARS-CoV-2 infection and should not be used as the sole basis for treatment or other patient management decisions.  A negative result may occur with improper specimen collection / handling, submission of specimen other than nasopharyngeal swab, presence of viral mutation(s) within the areas targeted by this assay, and inadequate number of viral copies (<250 copies / mL). A negative result must be combined with clinical observations, patient history, and epidemiological information. Fact Sheet for Patients:   BoilerBrush.com.cy Fact Sheet for Healthcare Providers: https://pope.com/ This test is not yet approved or cleared  by the [place] FDA and has been authorized for detection and/or diagnosis of SARS-CoV-2 by FDA under an Emergency Use Authorization (EUA).  This EUA will remain in effect (meaning this test can be used) for the duration of the COVID-19 declaration under Section 564(b)(1) of the Act, 21 U.S.C. section 360bbb-3(b)(1), unless the authorization is terminated or revoked sooner. Performed at [facility], [address] [place], [place] [address]   Culture, blood (routine x 2)     Status: None (Preliminary result)   Collection Time: 01/27/20  9:25 PM   Specimen: BLOOD  Result Value Ref Range Status   Specimen Description BLOOD RIGHT HAND  Final   Special Requests   Final    BOTTLES DRAWN AEROBIC AND ANAEROBIC Blood Culture adequate volume   Culture   Final    NO GROWTH 4 DAYS Performed at [facility], [address] [place], [place] [address]    Report Status PENDING   Incomplete  Culture, blood (routine x 2)     Status: None (Preliminary result)   Collection Time: 01/27/20  9:30 PM   Specimen: BLOOD  Result Value Ref Range Status   Specimen Description BLOOD RIGHT ARM  Final   Special Requests   Final    BOTTLES DRAWN AEROBIC AND ANAEROBIC Blood Culture results may not be optimal due to an inadequate volume of blood received in culture bottles   Culture   Final    NO GROWTH 4 DAYS Performed at [facility], [address] [place], [place] [address]    Report Status PENDING  Incomplete  MRSA PCR Screening     Status: None   Collection Time: 01/28/20  7:41 PM   Specimen: Nasal Mucosa; Nasopharyngeal  Result Value Ref Range Status   MRSA by PCR NEGATIVE NEGATIVE Final    Comment:        The GeneXpert MRSA Assay (FDA approved for NASAL specimens only), is one component of a comprehensive MRSA colonization surveillance program. It is not intended to diagnose MRSA infection nor to guide or monitor treatment for MRSA infections. Performed at [facility], [address], [place], [place] [address]      Labs: BNP (last 3 results) Recent Labs    06/29/19 1138 01/27/20 1854  BNP 74.0 356.6*   Basic Metabolic Panel: Recent Labs  Lab 01/27/20 1854 01/27/20 1903 01/28/20 0436  NA 136 131* 142  K 4.6 4.2  4.4  CL 88*  --  98  CO2 30  --  34*  GLUCOSE 310*  --  91  BUN 22  --  19  CREATININE 1.15*  --  0.88  CALCIUM 9.9  --  8.1*   Liver Function Tests: Recent Labs  Lab 01/27/20 1854 01/28/20 0436  AST 33 76*  ALT 23 75*  ALKPHOS 117 104  BILITOT 0.9 1.0  PROT 7.5 5.6*  ALBUMIN 3.9 2.8*   No results for input(s): LIPASE, AMYLASE in the last 168 hours. No results for input(s): AMMONIA in the last 168 hours. CBC: Recent Labs  Lab 01/27/20 1854 01/27/20 1903 01/28/20 0436  WBC 10.7*  --  11.7*  NEUTROABS 7.9*  --  9.1*  HGB 13.6 15.3* 11.0*  HCT 43.9 45.0 36.1  MCV 94.0  --  95.3  PLT 271  --  207    Cardiac Enzymes: No results for input(s): CKTOTAL, CKMB, CKMBINDEX, TROPONINI in the last 168 hours. BNP: Invalid input(s): POCBNP CBG: Recent Labs  Lab 01/30/20 1536 01/30/20 2039 01/31/20 0001 01/31/20 0345 01/31/20 0858  GLUCAP 115* 141* 126* 88 99   D-Dimer No results for input(s): DDIMER in the last 72 hours. Hgb A1c No results for input(s): HGBA1C in the last 72 hours. Lipid Profile No results for input(s): CHOL, HDL, LDLCALC, TRIG, CHOLHDL, LDLDIRECT in the last 72 hours. Thyroid function studies No results for input(s): TSH, T4TOTAL, T3FREE, THYROIDAB in the last 72 hours.  Invalid input(s): FREET3 Anemia work up No results for input(s): VITAMINB12, FOLATE, FERRITIN, TIBC, IRON, RETICCTPCT in the last 72 hours. Urinalysis    Component Value Date/Time   COLORURINE YELLOW 05/04/2014 1055   APPEARANCEUR CLOUDY (A) 05/04/2014 1055   LABSPEC 1.009 05/04/2014 1055   PHURINE 8.0 05/04/2014 1055   GLUCOSEU NEGATIVE 05/04/2014 1055   HGBUR NEGATIVE 05/04/2014 1055   HGBUR negative 01/04/2010 0808   BILIRUBINUR NEGATIVE 05/04/2014 1055   BILIRUBINUR n 07/16/2011 1320   KETONESUR NEGATIVE 05/04/2014 1055   PROTEINUR NEGATIVE 05/04/2014 1055   UROBILINOGEN 0.2 05/04/2014 1055   NITRITE NEGATIVE 05/04/2014 1055   LEUKOCYTESUR SMALL (A) 05/04/2014 1055   Sepsis Labs Invalid input(s): PROCALCITONIN,  WBC,  LACTICIDVEN Microbiology Recent Results (from the past 240 hour(s))  SARS Coronavirus 2 by RT PCR (hospital order, performed in [facility] lab) Nasopharyngeal Nasopharyngeal Swab     Status: None   Collection Time: 01/27/20  7:45 PM   Specimen: Nasopharyngeal Swab  Result Value Ref Range Status   SARS Coronavirus 2 NEGATIVE NEGATIVE Final    Comment: (NOTE) SARS-CoV-2 target nucleic acids are NOT DETECTED. The SARS-CoV-2 RNA is generally detectable in upper and lower respiratory specimens during the acute phase of infection. The lowest concentration  of SARS-CoV-2 viral copies this assay can detect is 250 copies / mL. A negative result does not preclude SARS-CoV-2 infection and should not be used as the sole basis for treatment or other patient management decisions.  A negative result may occur with improper specimen collection / handling, submission of specimen other than nasopharyngeal swab, presence of viral mutation(s) within the areas targeted by this assay, and inadequate number of viral copies (<250 copies / mL). A negative result must be combined with clinical observations, patient history, and epidemiological information. Fact Sheet for Patients:   BoilerBrush.com.cyhttps://www.fda.gov/media/136312/download Fact Sheet for Healthcare Providers: https://pope.com/https://www.fda.gov/media/136313/download This test is not yet approved or cleared  by the [place] FDA and has been authorized for detection and/or diagnosis of  SARS-CoV-2 by FDA under an Emergency Use Authorization (EUA).  This EUA will remain in effect (meaning this test can be used) for the duration of the COVID-19 declaration under Section 564(b)(1) of the Act, 21 U.S.C. section 360bbb-3(b)(1), unless the authorization is terminated or revoked sooner. Performed at [facility], [address] [place], [place] [address]   Culture, blood (routine x 2)     Status: None (Preliminary result)   Collection Time: 01/27/20  9:25 PM   Specimen: BLOOD  Result Value Ref Range Status   Specimen Description BLOOD RIGHT HAND  Final   Special Requests   Final    BOTTLES DRAWN AEROBIC AND ANAEROBIC Blood Culture adequate volume   Culture   Final    NO GROWTH 4 DAYS Performed at [facility], [address] [place], [place] [address]    Report Status PENDING  Incomplete  Culture, blood (routine x 2)     Status: None (Preliminary result)   Collection Time: 01/27/20  9:30 PM   Specimen: BLOOD  Result Value Ref Range Status   Specimen Description BLOOD RIGHT ARM  Final   Special Requests    Final    BOTTLES DRAWN AEROBIC AND ANAEROBIC Blood Culture results may not be optimal due to an inadequate volume of blood received in culture bottles   Culture   Final    NO GROWTH 4 DAYS Performed at [facility], [address] [place], [place] [address]    Report Status PENDING  Incomplete  MRSA PCR Screening     Status: None   Collection Time: 01/28/20  7:41 PM   Specimen: Nasal Mucosa; Nasopharyngeal  Result Value Ref Range Status   MRSA by PCR NEGATIVE NEGATIVE Final    Comment:        The GeneXpert MRSA Assay (FDA approved for NASAL specimens only), is one component of a comprehensive MRSA colonization surveillance program. It is not intended to diagnose MRSA infection nor to guide or monitor treatment for MRSA infections. Performed at [facility], [address], [place], [place] [address]      Time coordinating discharge:  SIGNED:   Alwyn Ren, MD  [facility] 01/31/2020, 9:07 AM Pager   If 7PM-7AM, please contact night-coverage [email or web address] Password [email or web address]

## 2020-01-31 NOTE — TOC Transition Note (Signed)
Transition of Care [facility]) - CM/SW Discharge Note   Patient Details  Name: MARGOT ORIORDAN MRN: [id number] Date of Birth: [birth year]  Transition of Care [facility]) CM/SW Contact:  Kermit Balo, RN Phone Number: 01/31/2020, 10:20 AM   Clinical Narrative:    Pt discharging home with hospice services through [facility]. Sherry with [facility] is aware of d/c and hospice will see the patient this pm.  Pt transporting home via [facility]. Report called and bedside RN updated. D/c packet at the desk.  Pt has oxygen at home and 24 hour caregivers.  Pt going to [facility] [place] [address].    Final next level of care: Home w Hospice Care Barriers to Discharge: No Barriers Identified   Patient Goals and CMS Choice        Discharge Placement                       Discharge Plan and Services                                     Social Determinants of Health (SDOH) Interventions     Readmission Risk Interventions No flowsheet data found.

## 2020-02-01 LAB — CULTURE, BLOOD (ROUTINE X 2)
Culture: NO GROWTH
Culture: NO GROWTH
Special Requests: ADEQUATE

## 2020-02-08 DEATH — deceased

## 2020-07-24 IMAGING — DX DG CHEST 1V PORT
1 series · 1 of 1 positions shown · non-contrast
Comparison: 06/29/2019

CLINICAL DATA: Respiratory distress

EXAM:
PORTABLE CHEST 1 VIEW

[chest ap]
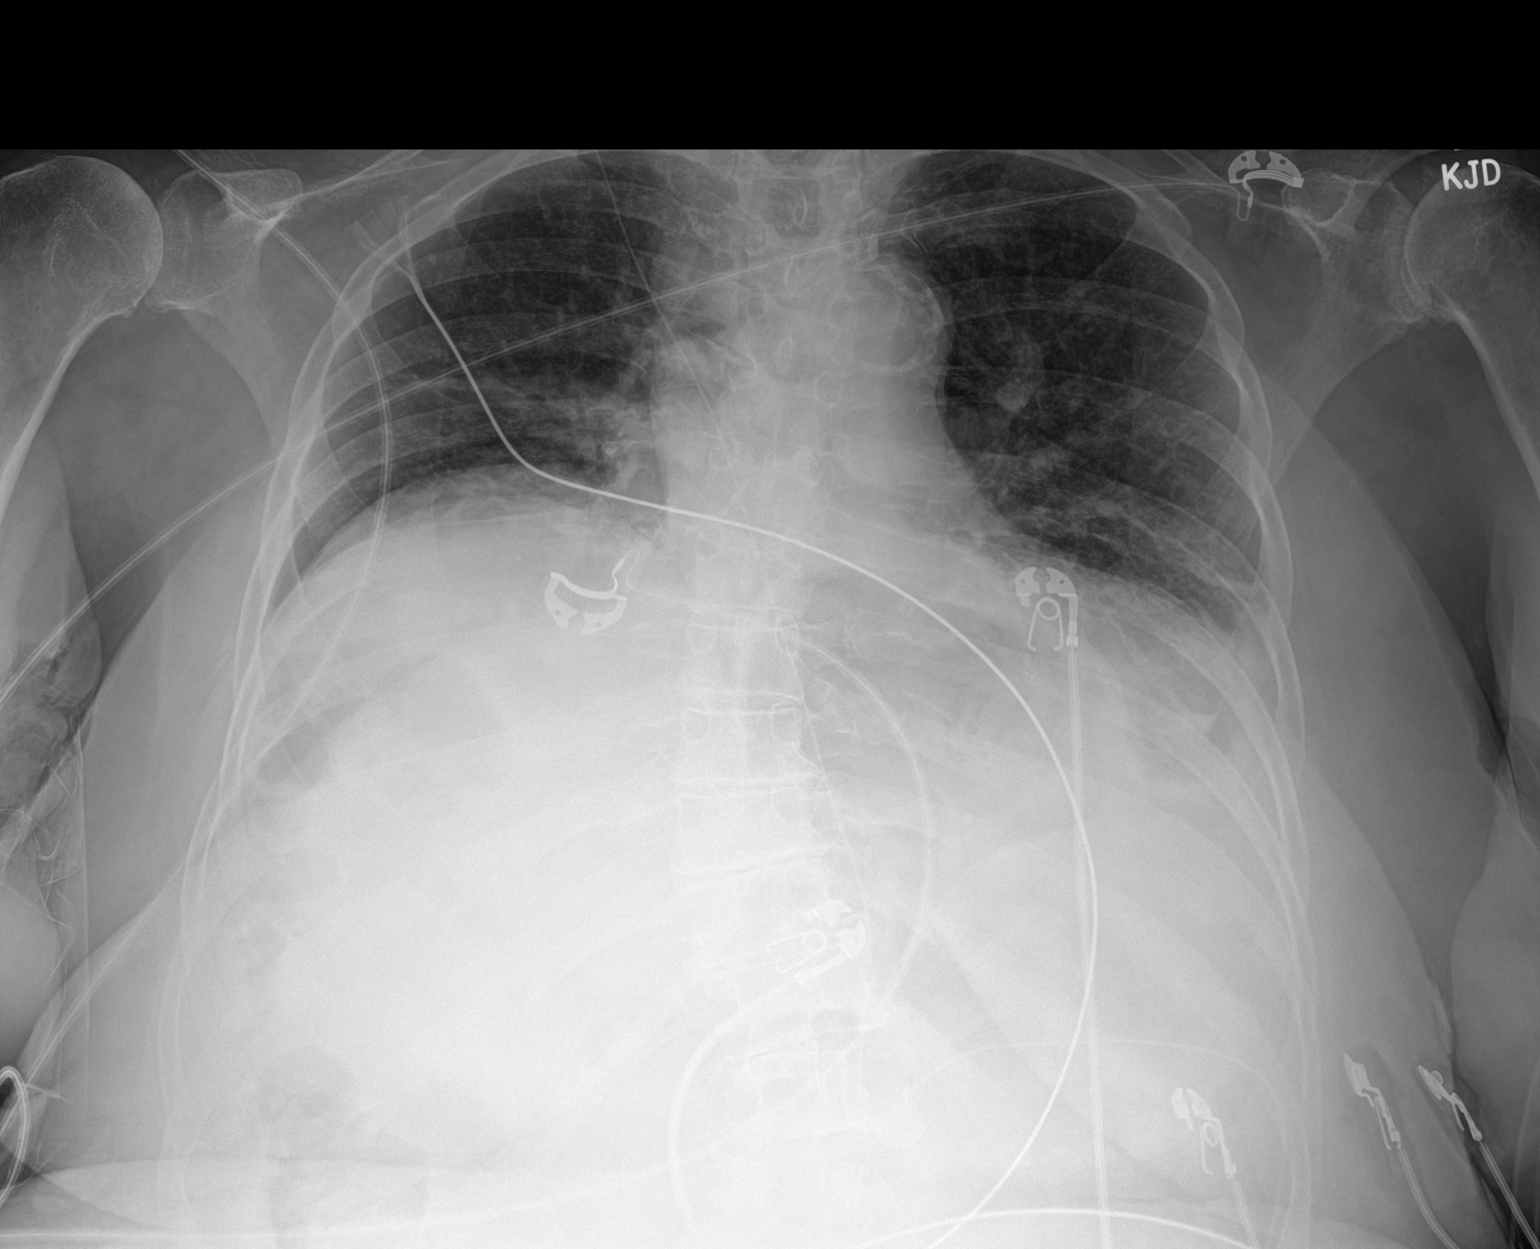

[1 of 1 positions shown; findings below may reference images not displayed]

FINDINGS: Hypoventilatory change with chronic elevation of right diaphragm.
Moderate hiatal hernia slightly increased. Increased left basilar
opacity. Subsegmental atelectasis right base. Stable
cardiomediastinal silhouette with aortic atherosclerosis. No
pneumothorax. Right-sided shunt tubing.
IMPRESSION: 1. Low lung volumes with subsegmental atelectasis at the right base.
Airspace disease at the left base may reflect atelectasis or
possible pneumonia
2. Increased hiatal hernia.  Chronic elevation of right diaphragm

## 2020-07-24 IMAGING — CT CT CHEST W/ CM
2 of 4 series · 14 of 36 positions shown, 17 images · IV contrast (APPLIED)
Comparison: Chest radiograph earlier today. Chest CTA 06/29/2019

CLINICAL DATA: Respiratory illness, nondiagnostic xray.

EXAM:
CT CHEST WITH CONTRAST
TECHNIQUE: Multidetector CT imaging of the chest was performed during
intravenous contrast administration.
CONTRAST:  75mL OMNIPAQUE IOHEXOL 300 MG/ML  SOLN

[Series 3: chest w · axial · 0.74mm/px · z∈[-332,-90]mm · 11 of 145 slices shown, 14 images]
[im 12/145  mediastinal]
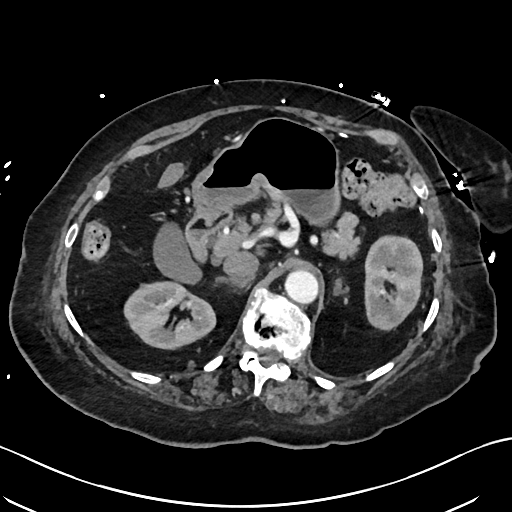
[im 12/145  lung]
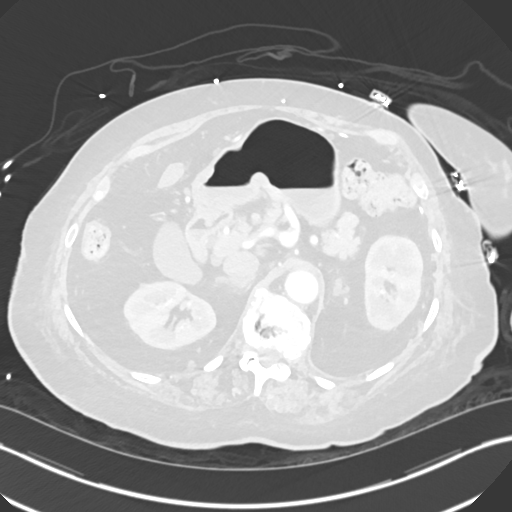
[im 23/145  lung]
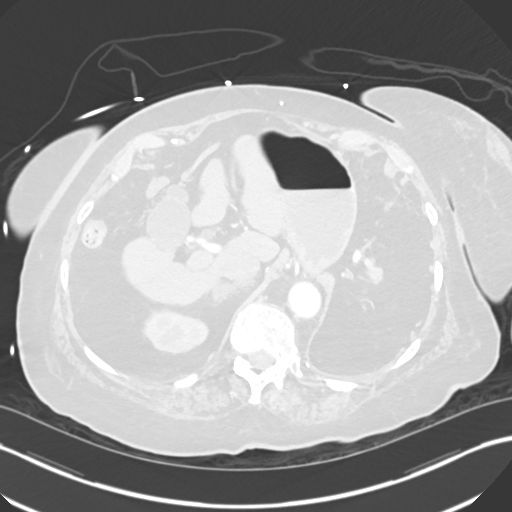
[im 34/145  lung]
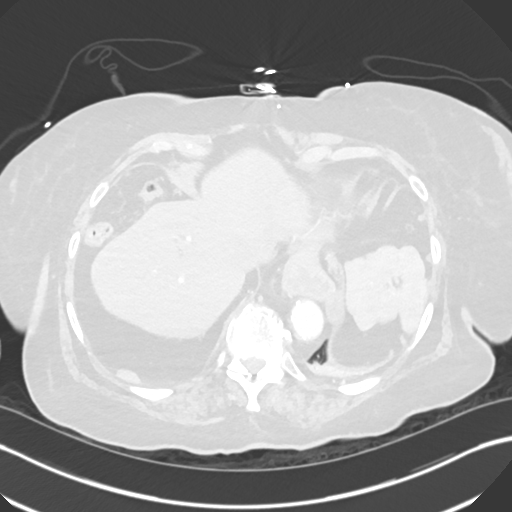
[im 45/145  lung]
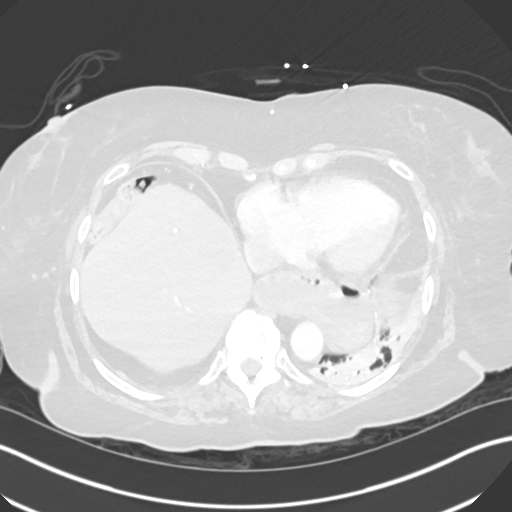
[im 56/145  mediastinal]
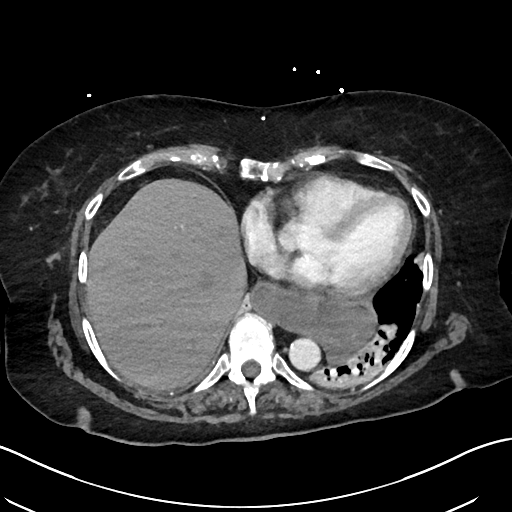
[im 56/145  lung]
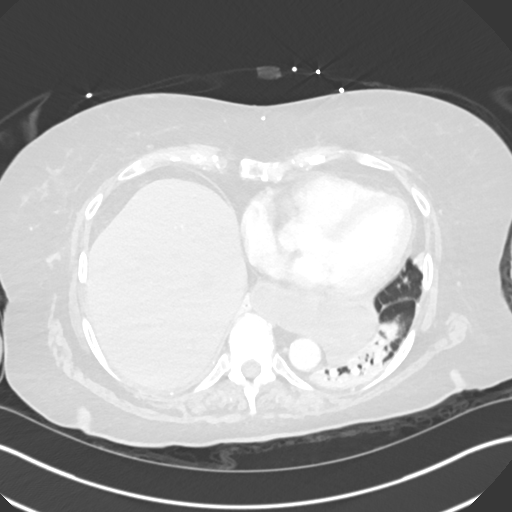
[im 78/145  lung]
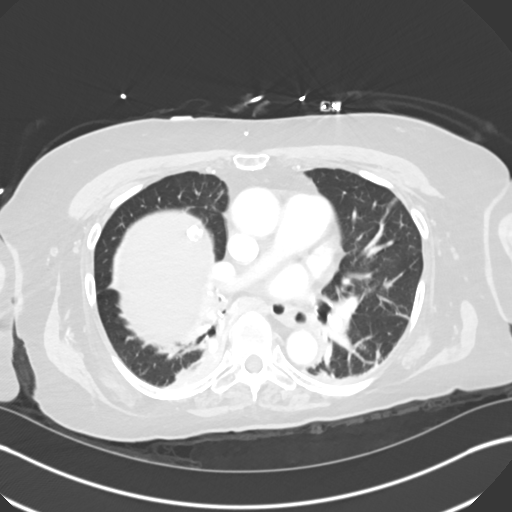
[im 89/145  lung]
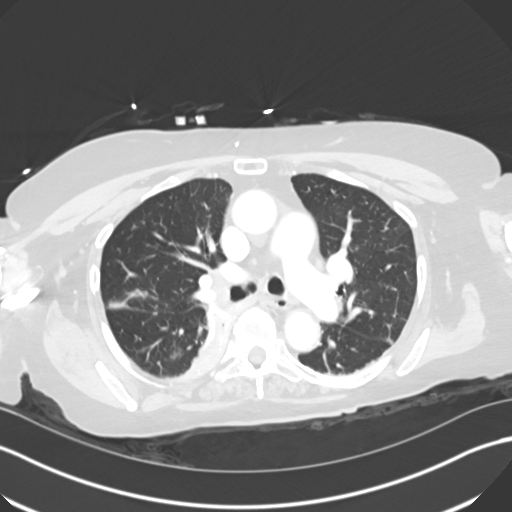
[im 100/145  lung]
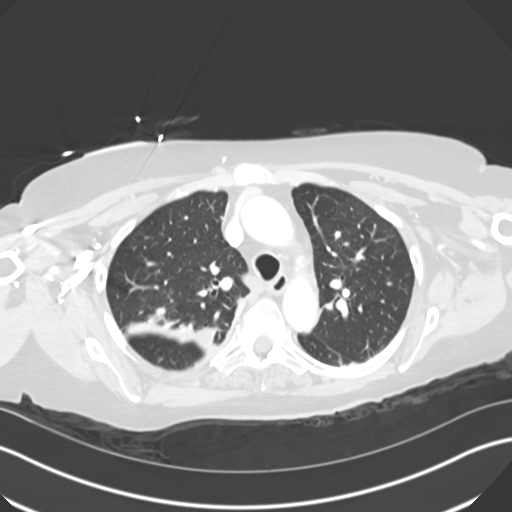
[im 111/145  mediastinal]
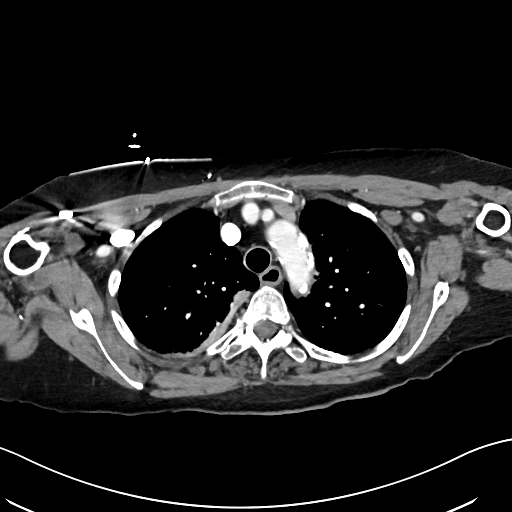
[im 111/145  lung]
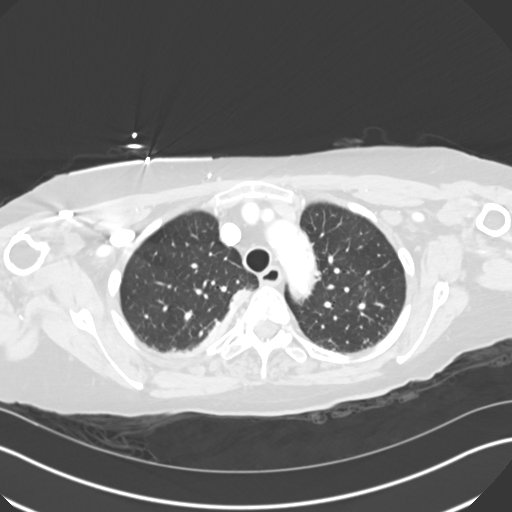
[im 122/145  lung]
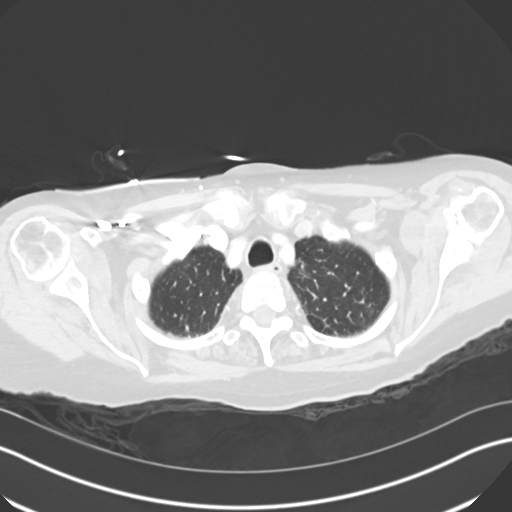
[im 133/145  lung]
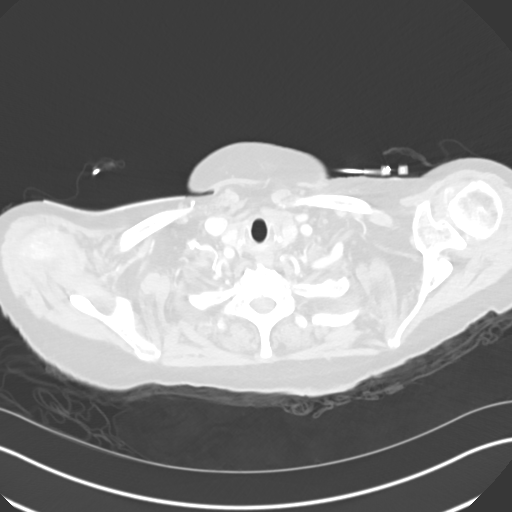

[Series 6: cor · coronal · 0.57mm/px · 3 of 140 slices shown]
[im 28/140  lung]
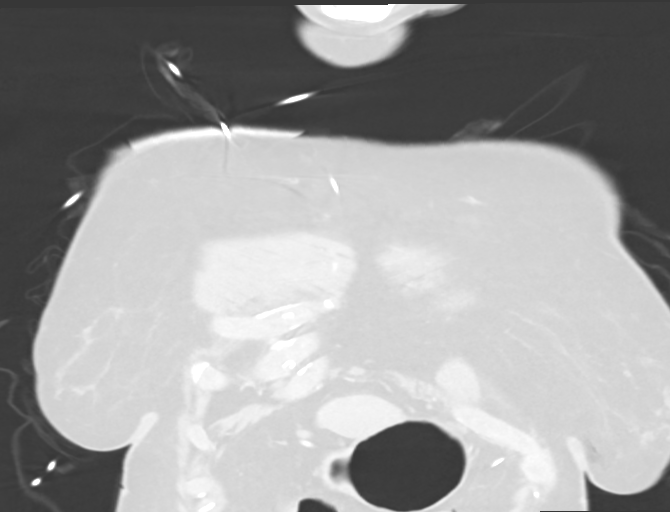
[im 56/140  lung]
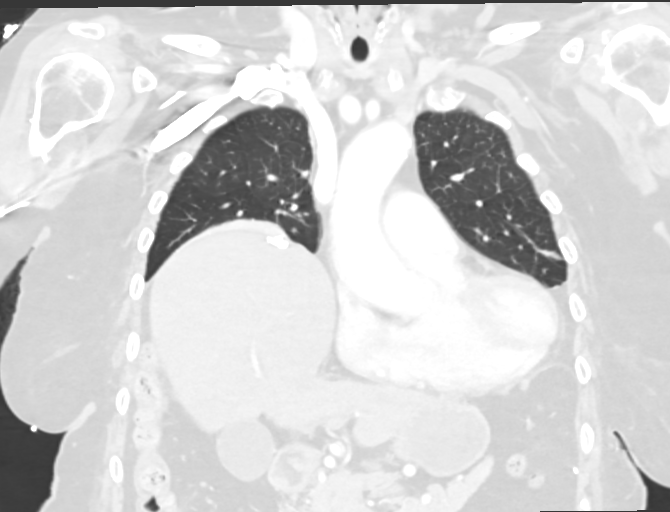
[im 84/140  lung]
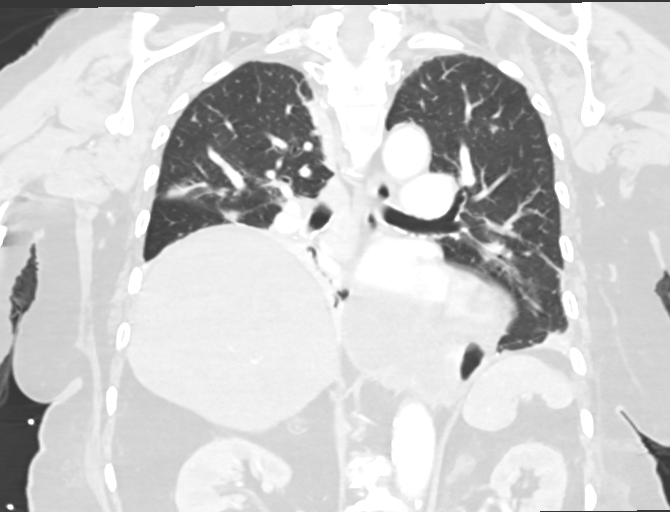

[14 of 36 positions shown; findings below may reference images not displayed]

FINDINGS: Cardiovascular: Aortic atherosclerosis. No aortic dissection. No
central pulmonary embolus to the lobar level. Hiatal hernia
displaces the heart anteriorly. Normal heart size. Coronary artery
calcifications. Small amount pericardial fluid in the pericardial
recesses without significant effusion.

Mediastinum/Nodes: Large hiatal hernia, approximately 50% of the
stomach is intrathoracic. Hiatal hernia has increased from prior
exam and is fluid-filled. Remainder the esophagus is patulous. No
visualized thyroid nodule. Small mediastinal lymph nodes are not
enlarged by size criteria.

Lungs/Pleura: Elevated right hemidiaphragm with adjacent compressive
atelectasis of the right middle and right lower lobes. No dependent
airspace disease in the left greater than right lower lobe with air
bronchograms. Mild consolidation in the perifissural and dependent
right upper lobe. Linear atelectasis in the lingula. No pulmonary
edema. No significant pleural fluid. No intraluminal debris within
the trachea or mainstem bronchi. Compressive changes in the right
middle and lower lobe bronchus related to elevated hemidiaphragm.

Upper Abdomen: Peripherally calcified liver lesion in the dome,
unchanged. Mild left adrenal thickening not included in the prior
field of view.

Musculoskeletal: Degenerative change throughout the spine. Mild
scoliosis. There are no acute or suspicious osseous abnormalities.
There is shunt catheter tubing in the right anterior chest wall.
IMPRESSION: 1. Elevated right hemidiaphragm with adjacent compressive
atelectasis of the right middle and right lower lobes. Dependent
airspace disease in the dependent lungs, left greater than right
lower lobe and dependent right upper lobe. This may represent
atelectasis or pneumonia, including aspiration, however there is no
debris in the trachea or mainstem bronchi.
2. Large hiatal hernia, approximately 50% of the stomach is
intrathoracic. This has increased from prior exam and is
fluid-filled.

Aortic Atherosclerosis ([id number]).
